# Patient Record
Sex: Male | Born: 1965 | Race: White | Hispanic: No | Marital: Married | Smoking: Former smoker
Health system: Southern US, Community
[De-identification: ages and names within clinical notes are randomized; demographics above are authoritative.]

## PROBLEM LIST (undated history)

## (undated) DIAGNOSIS — I10 Essential (primary) hypertension: Secondary | ICD-10-CM

## (undated) DIAGNOSIS — J45909 Unspecified asthma, uncomplicated: Secondary | ICD-10-CM

## (undated) HISTORY — PX: NASAL SEPTUM SURGERY: SHX37

---

## 2005-04-14 ENCOUNTER — Ambulatory Visit: Payer: Self-pay | Admitting: Internal Medicine

## 2005-04-19 ENCOUNTER — Ambulatory Visit: Payer: Self-pay | Admitting: Cardiology

## 2005-04-19 IMAGING — CT CT PARANASAL SINUSES LIMITED
1 of 2 series · 16 of 25 positions shown, 20 images · non-contrast
Comparison: none

HISTORY: Cough, sinus pain and pressure

[Series 4: ltd sinus 3.0 h30s · axial · 0.29mm/px · z∈[-93,+5]mm · 16 of 24 slices shown, 20 images]
[im 2/24  brain]
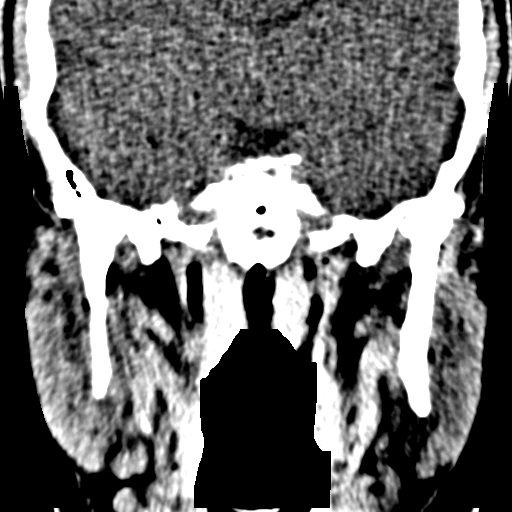
[im 2/24  bone]
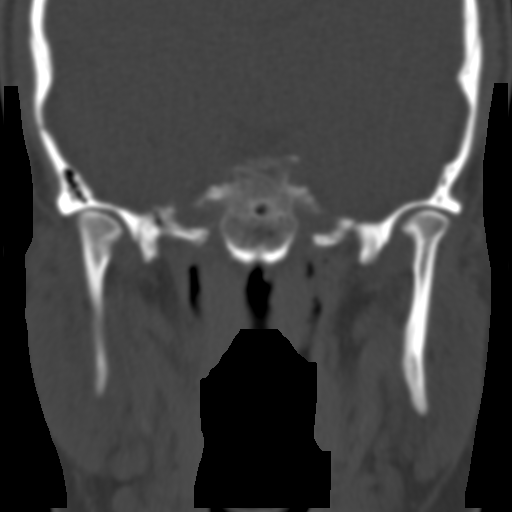
[im 4/24  bone]
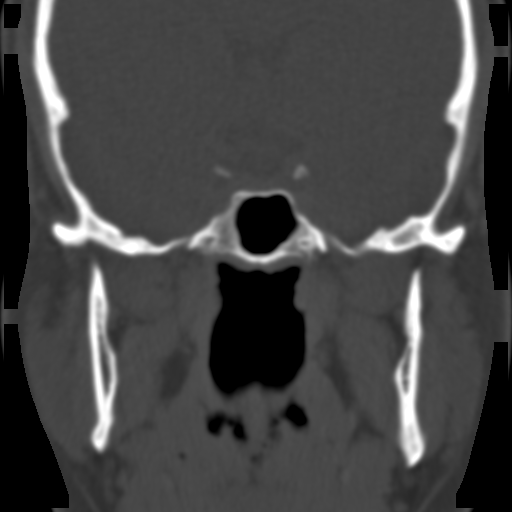
[im 5/24  bone]
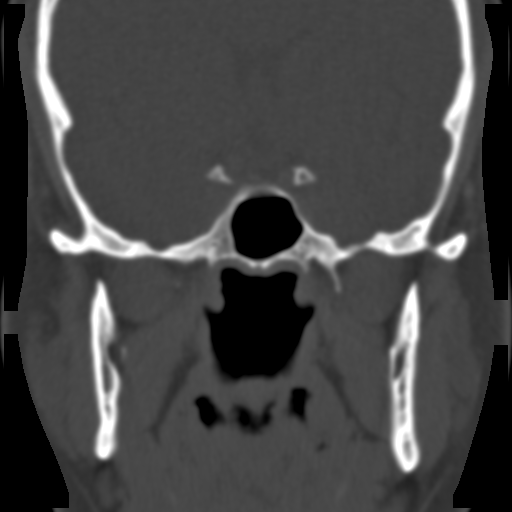
[im 6/24  bone]
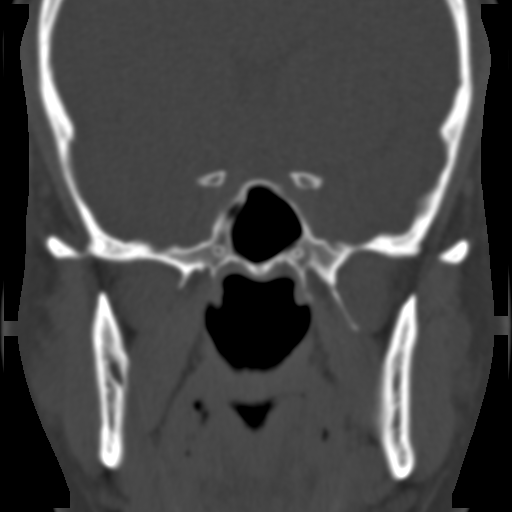
[im 8/24  brain]
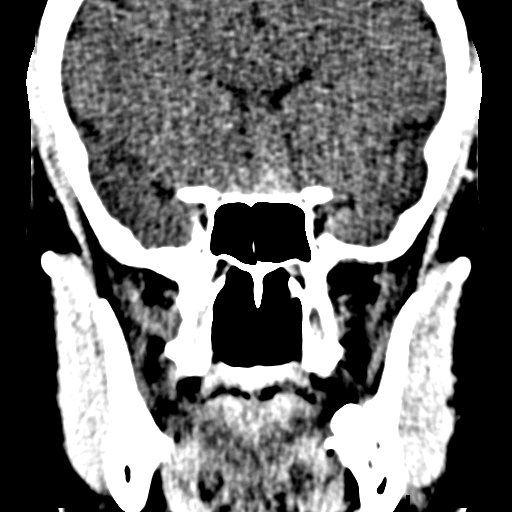
[im 8/24  bone]
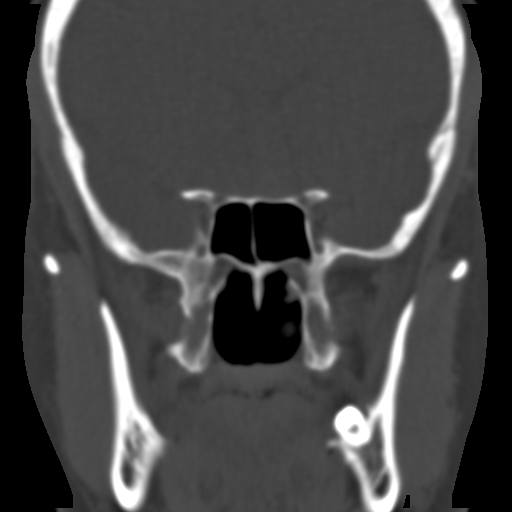
[im 9/24  bone]
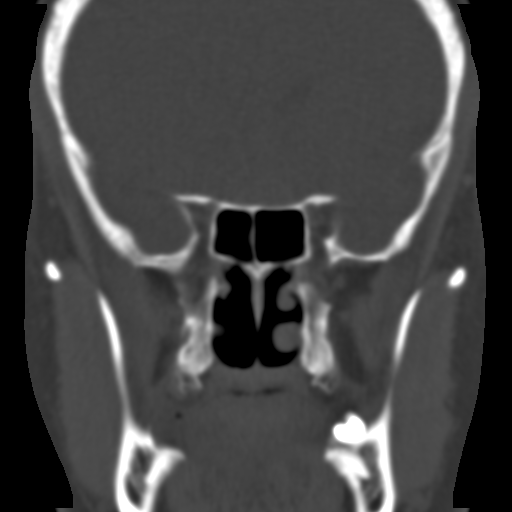
[im 10/24  bone]
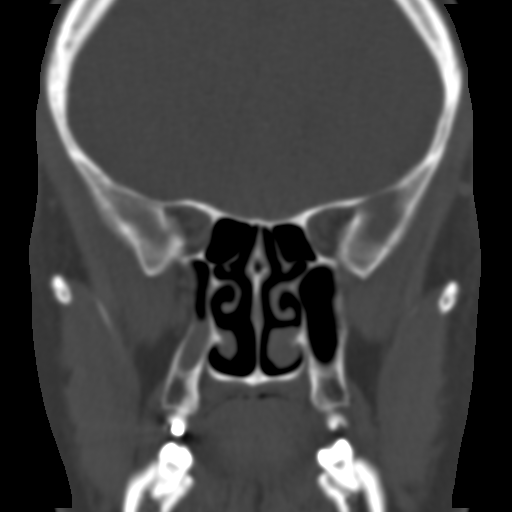
[im 12/24  bone]
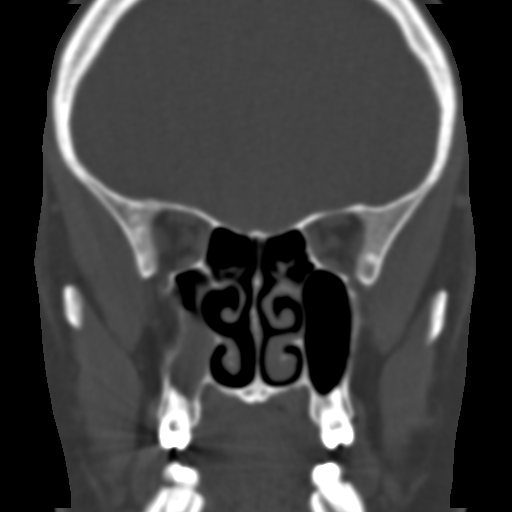
[im 13/24  brain]
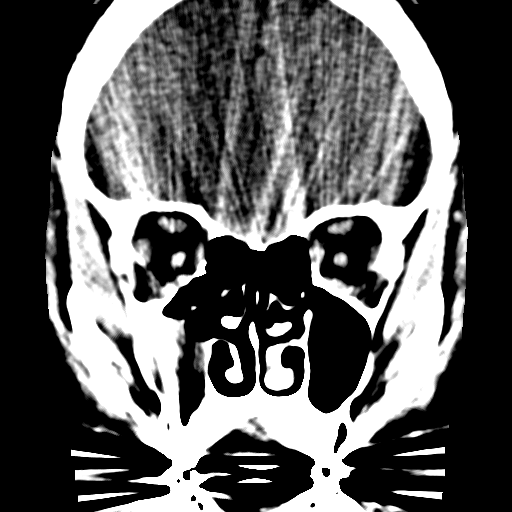
[im 13/24  bone]
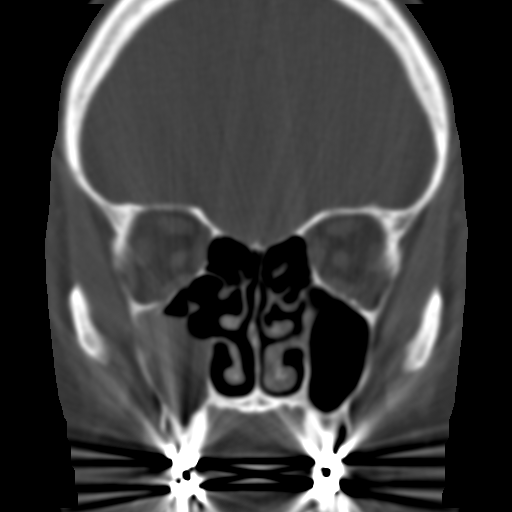
[im 15/24  bone]
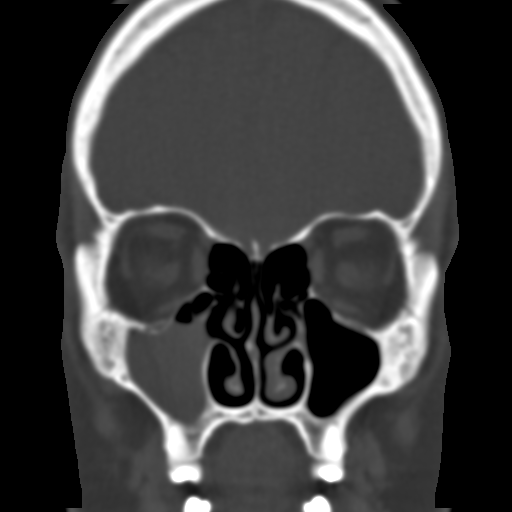
[im 16/24  bone]
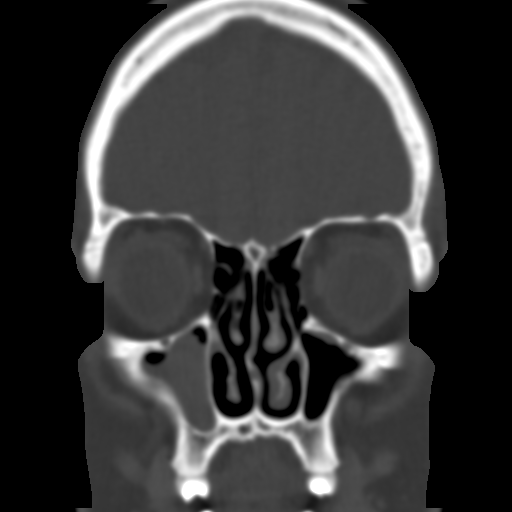
[im 17/24  bone]
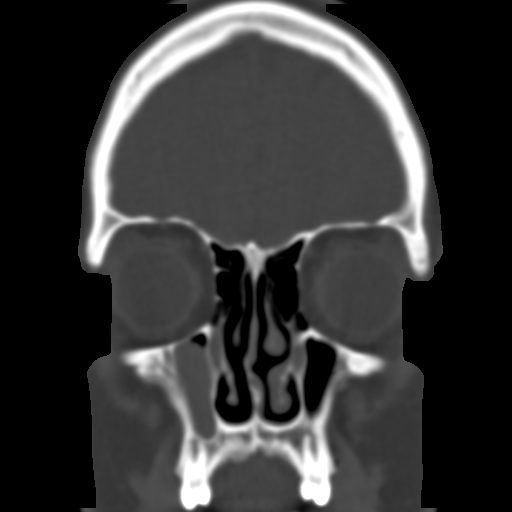
[im 19/24  brain]
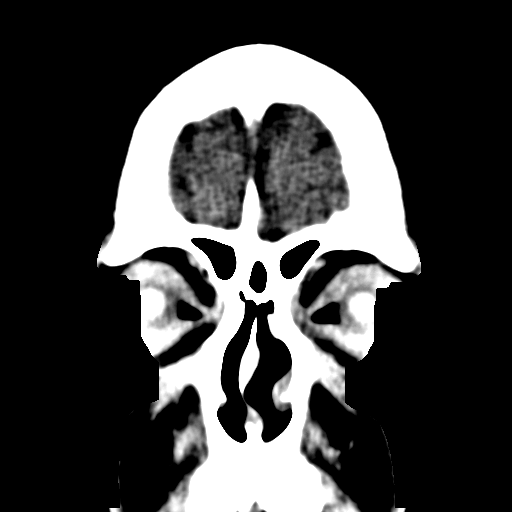
[im 19/24  bone]
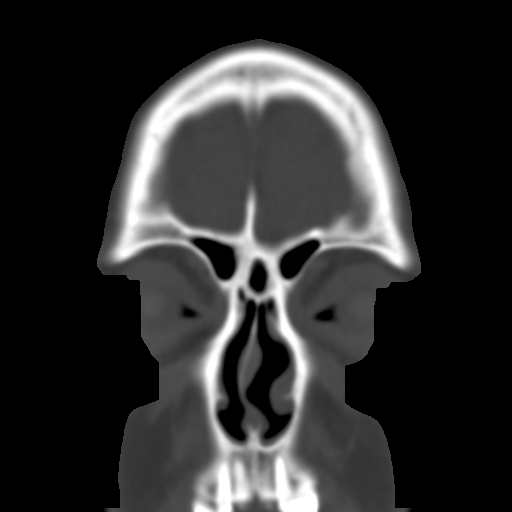
[im 20/24  bone]
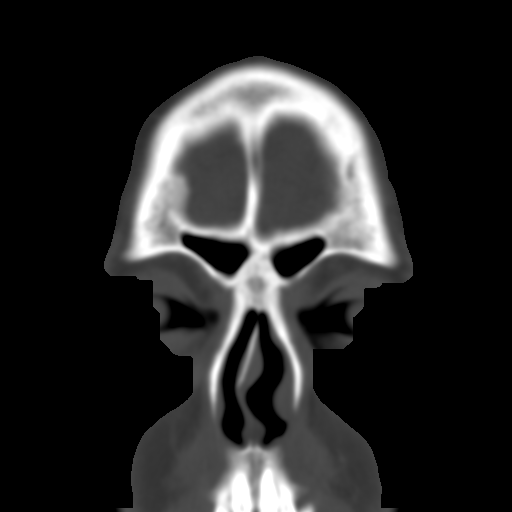
[im 21/24  bone]
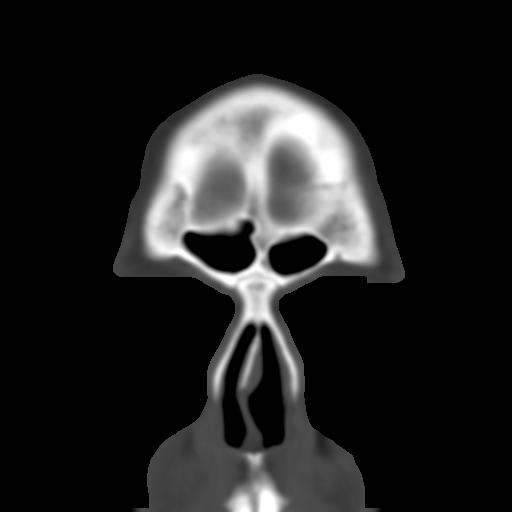
[im 23/24  bone]
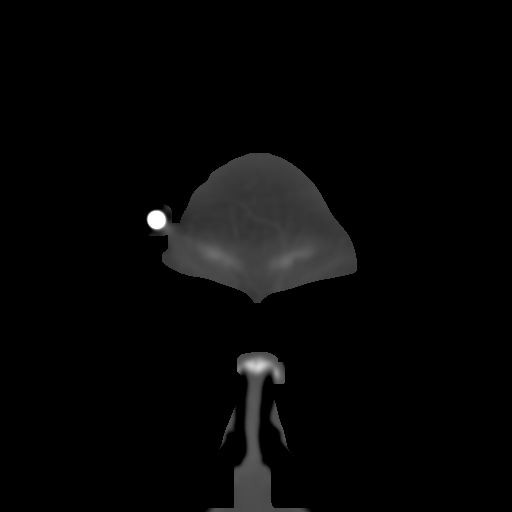

[16 of 25 positions shown; findings below may reference images not displayed]

CT SINUSES LIMITED WITHOUT CONTRAST:

Multidetector noncontrast contiguous CT images paranasal sinuses.
No prior exam for comparison.
Right side of face marked with BB.

Mild nasal septum deviation to right anteriorly.
Subtotal opacification right maxillary sinus with air-fluid level compatible
with acute sinusitis.
Remaining paranasal sinuses clear.
No fracture or bone destruction.
Scattered beam hardening artifacts from dental fillings.
IMPRESSION: Right maxillary sinusitis.

## 2005-04-30 ENCOUNTER — Ambulatory Visit: Payer: Self-pay | Admitting: Pulmonary Disease

## 2005-05-10 ENCOUNTER — Ambulatory Visit: Payer: Self-pay | Admitting: Internal Medicine

## 2005-09-24 ENCOUNTER — Ambulatory Visit: Payer: Self-pay | Admitting: Unknown Physician Specialty

## 2006-10-23 ENCOUNTER — Other Ambulatory Visit: Payer: Self-pay

## 2006-10-23 ENCOUNTER — Emergency Department: Payer: Self-pay | Admitting: Emergency Medicine

## 2006-10-23 IMAGING — CR DG CHEST 2V
1 series · 2 of 2 positions shown · non-contrast
Comparison: none

REASON FOR EXAM: chest pain
COMMENTS:

PROCEDURE:     DXR - DXR CHEST PA (OR AP) AND LATERAL  - [DATE]  [DATE]
RESULT:     The lung fields are clear. The heart, mediastinal and osseous
structures show no significant abnormalities.

[Series 1: view not recorded · 0.17mm/px · 2 of 2 slices shown]
[im 1/2]
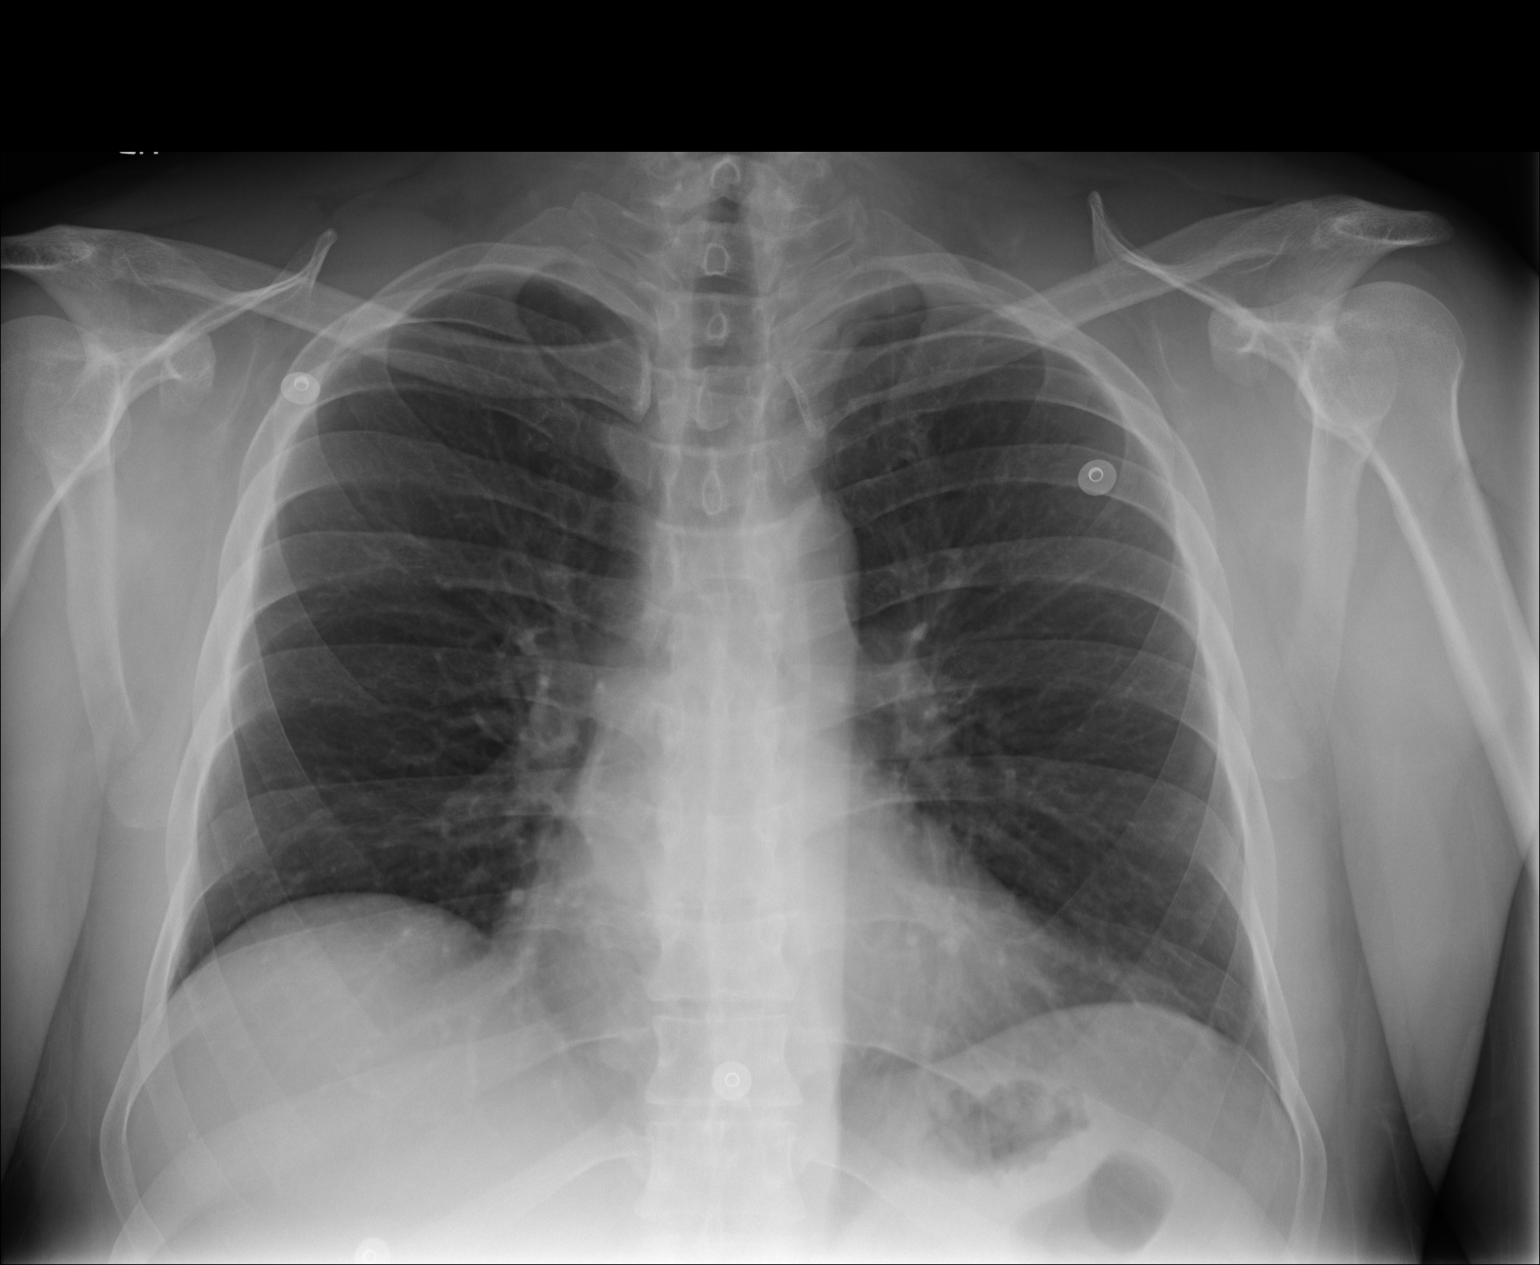
[im 2/2]
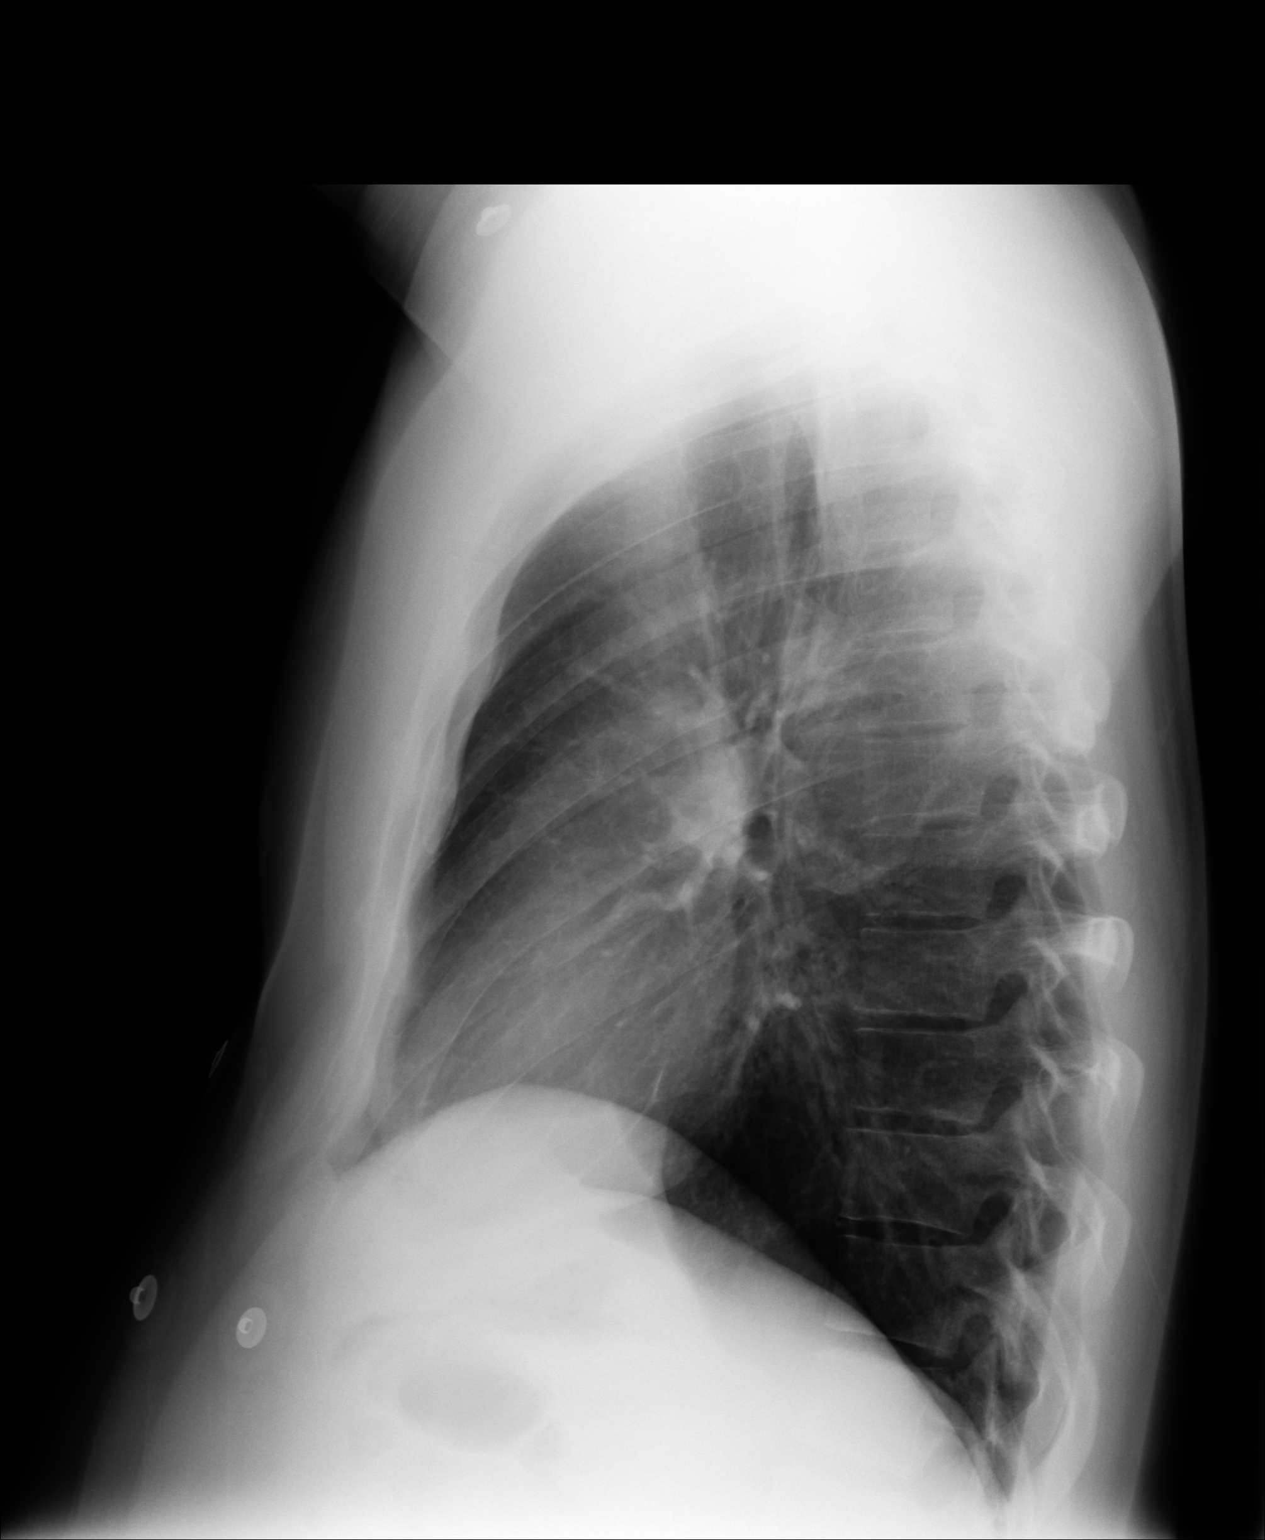

[2 of 2 positions shown; findings below may reference images not displayed]

IMPRESSION: 1.     No significant abnormalities are noted.

## 2006-10-25 ENCOUNTER — Ambulatory Visit: Payer: Self-pay | Admitting: Cardiology

## 2008-10-07 ENCOUNTER — Emergency Department: Payer: Self-pay | Admitting: Internal Medicine

## 2009-02-25 ENCOUNTER — Inpatient Hospital Stay: Payer: Self-pay | Admitting: Psychiatry

## 2009-06-10 ENCOUNTER — Emergency Department: Payer: Self-pay | Admitting: Emergency Medicine

## 2010-08-27 ENCOUNTER — Emergency Department: Payer: Self-pay | Admitting: *Deleted

## 2010-08-27 IMAGING — CR DG CHEST 1V PORT
1 series · 1 of 1 positions shown · non-contrast
Comparison: none

REASON FOR EXAM: cp
COMMENTS:

[view not recorded]
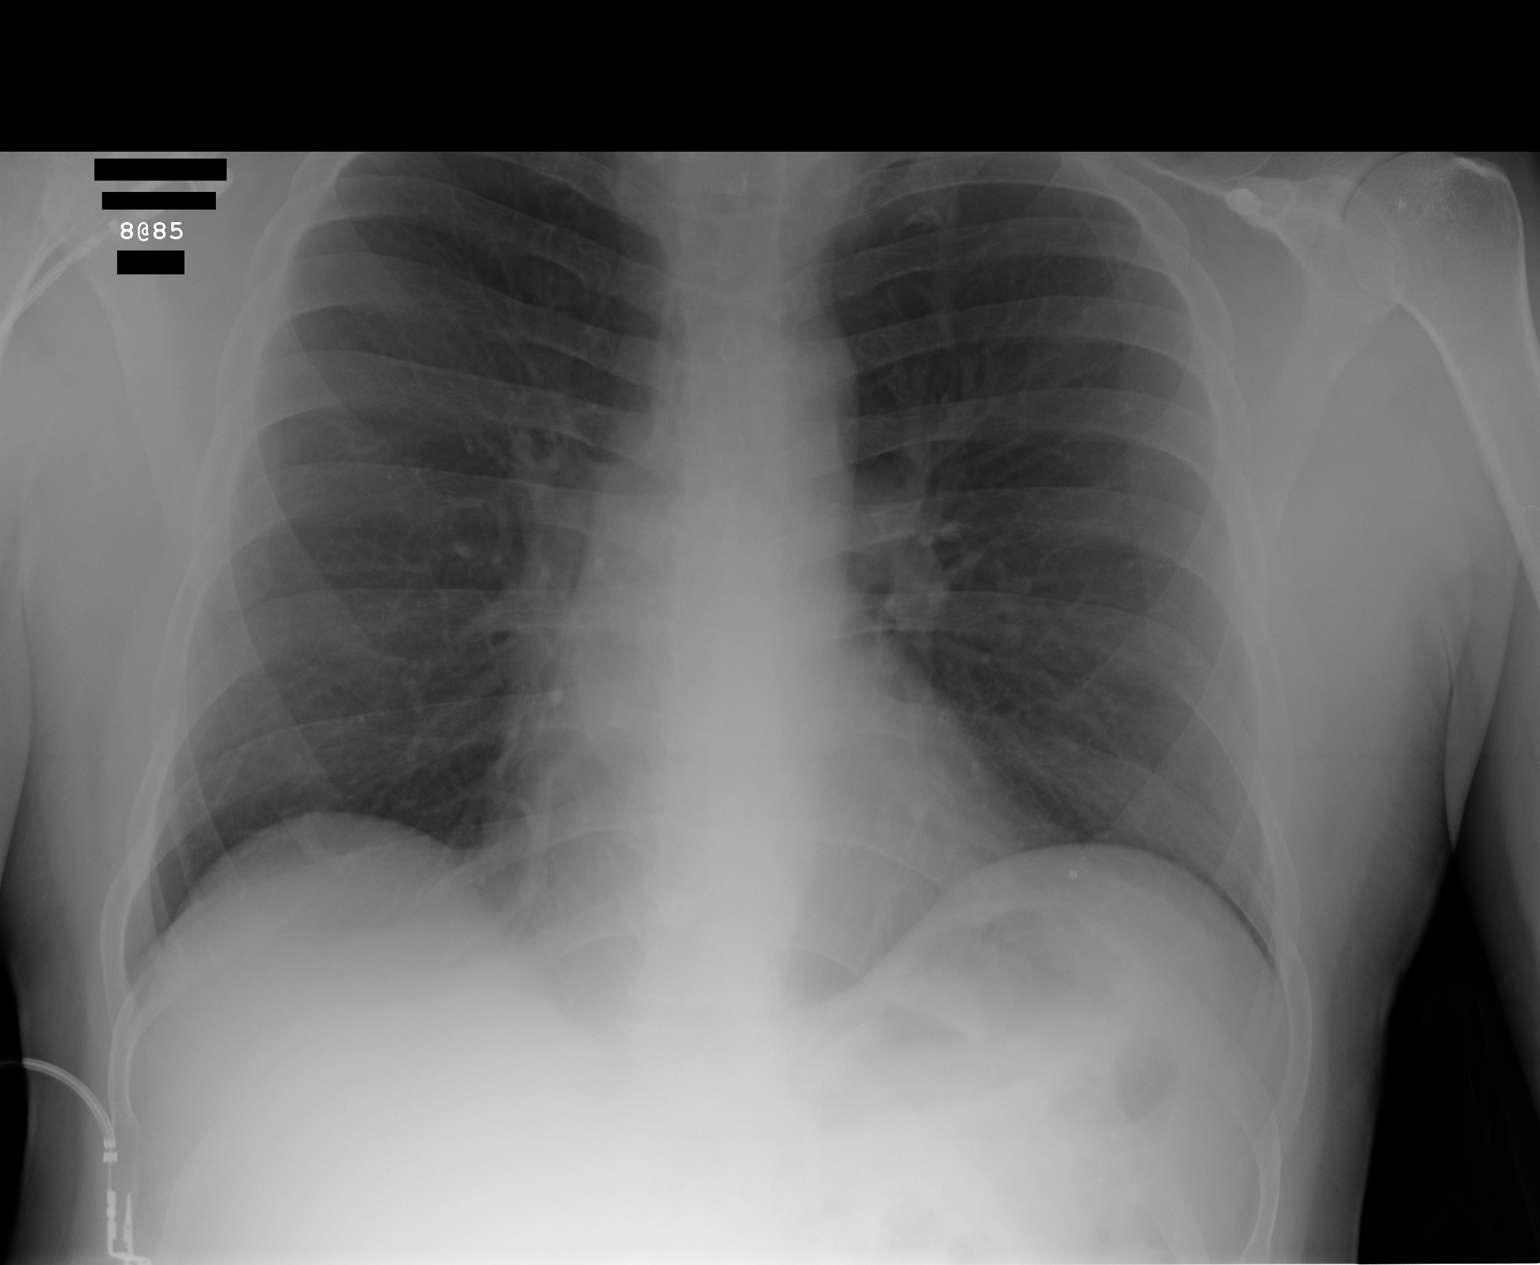

[1 of 1 positions shown; findings below may reference images not displayed]

PROCEDURE:     DXR - DXR PORTABLE CHEST SINGLE VIEW  - [DATE] [DATE]

RESULT:     Comparison is made to the prior exam of [DATE].

The lung fields are clear. No pneumonia, pneumothorax or pleural effusion is
seen. The heart size is normal. The mediastinal and osseous structures show
no significant abnormalities.
IMPRESSION: No acute changes are identified.

## 2014-12-05 ENCOUNTER — Other Ambulatory Visit: Payer: Self-pay

## 2014-12-05 ENCOUNTER — Emergency Department: Payer: BLUE CROSS/BLUE SHIELD

## 2014-12-05 ENCOUNTER — Emergency Department
Admission: EM | Admit: 2014-12-05 | Discharge: 2014-12-05 | Disposition: A | Discharge status: Home / Self Care | Payer: BLUE CROSS/BLUE SHIELD | Attending: Emergency Medicine | Admitting: Emergency Medicine

## 2014-12-05 DIAGNOSIS — Z880 Allergy status to penicillin: Secondary | ICD-10-CM | POA: Diagnosis not present

## 2014-12-05 DIAGNOSIS — R079 Chest pain, unspecified: Secondary | ICD-10-CM | POA: Insufficient documentation

## 2014-12-05 DIAGNOSIS — R0602 Shortness of breath: Secondary | ICD-10-CM | POA: Diagnosis not present

## 2014-12-05 LAB — BASIC METABOLIC PANEL
Anion gap: 9 (ref 5–15)
BUN: 13 mg/dL (ref 6–20)
CALCIUM: 9.1 mg/dL (ref 8.9–10.3)
CO2: 25 mmol/L (ref 22–32)
CREATININE: 0.9 mg/dL (ref 0.61–1.24)
Chloride: 102 mmol/L (ref 101–111)
GFR calc non Af Amer: >60 mL/min (ref >60)
Glucose, Bld: 123 mg/dL — ABNORMAL HIGH (ref 65–99)
Potassium: 3.6 mmol/L (ref 3.5–5.1)
SODIUM: 136 mmol/L (ref 135–145)

## 2014-12-05 LAB — CBC
HCT: 47.7 % (ref 40.0–52.0)
Hemoglobin: 15.8 g/dL (ref 13.0–18.0)
MCH: 30.4 pg (ref 26.0–34.0)
MCHC: 33.2 g/dL (ref 32.0–36.0)
MCV: 91.7 fL (ref 80.0–100.0)
Platelets: 351 10*3/uL (ref 150–440)
RBC: 5.21 MIL/uL (ref 4.40–5.90)
RDW: 13.3 % (ref 11.5–14.5)
WBC: 11.6 10*3/uL — AB (ref 3.8–10.6)

## 2014-12-05 LAB — TROPONIN I: Troponin I: <0.03 ng/mL (ref <0.031)

## 2014-12-05 IMAGING — CR DG CHEST 2V
1 series · 2 of 2 positions shown · non-contrast
Comparison: [DATE]

CLINICAL DATA: Left-sided chest pain with radiation to left arm for
1 day. Intermittent difficulty breathing

EXAM:
CHEST  2 VIEW

[Series 1: dg chest 2 view · 0.14mm/px · 2 of 2 slices shown]
[im 1/2]
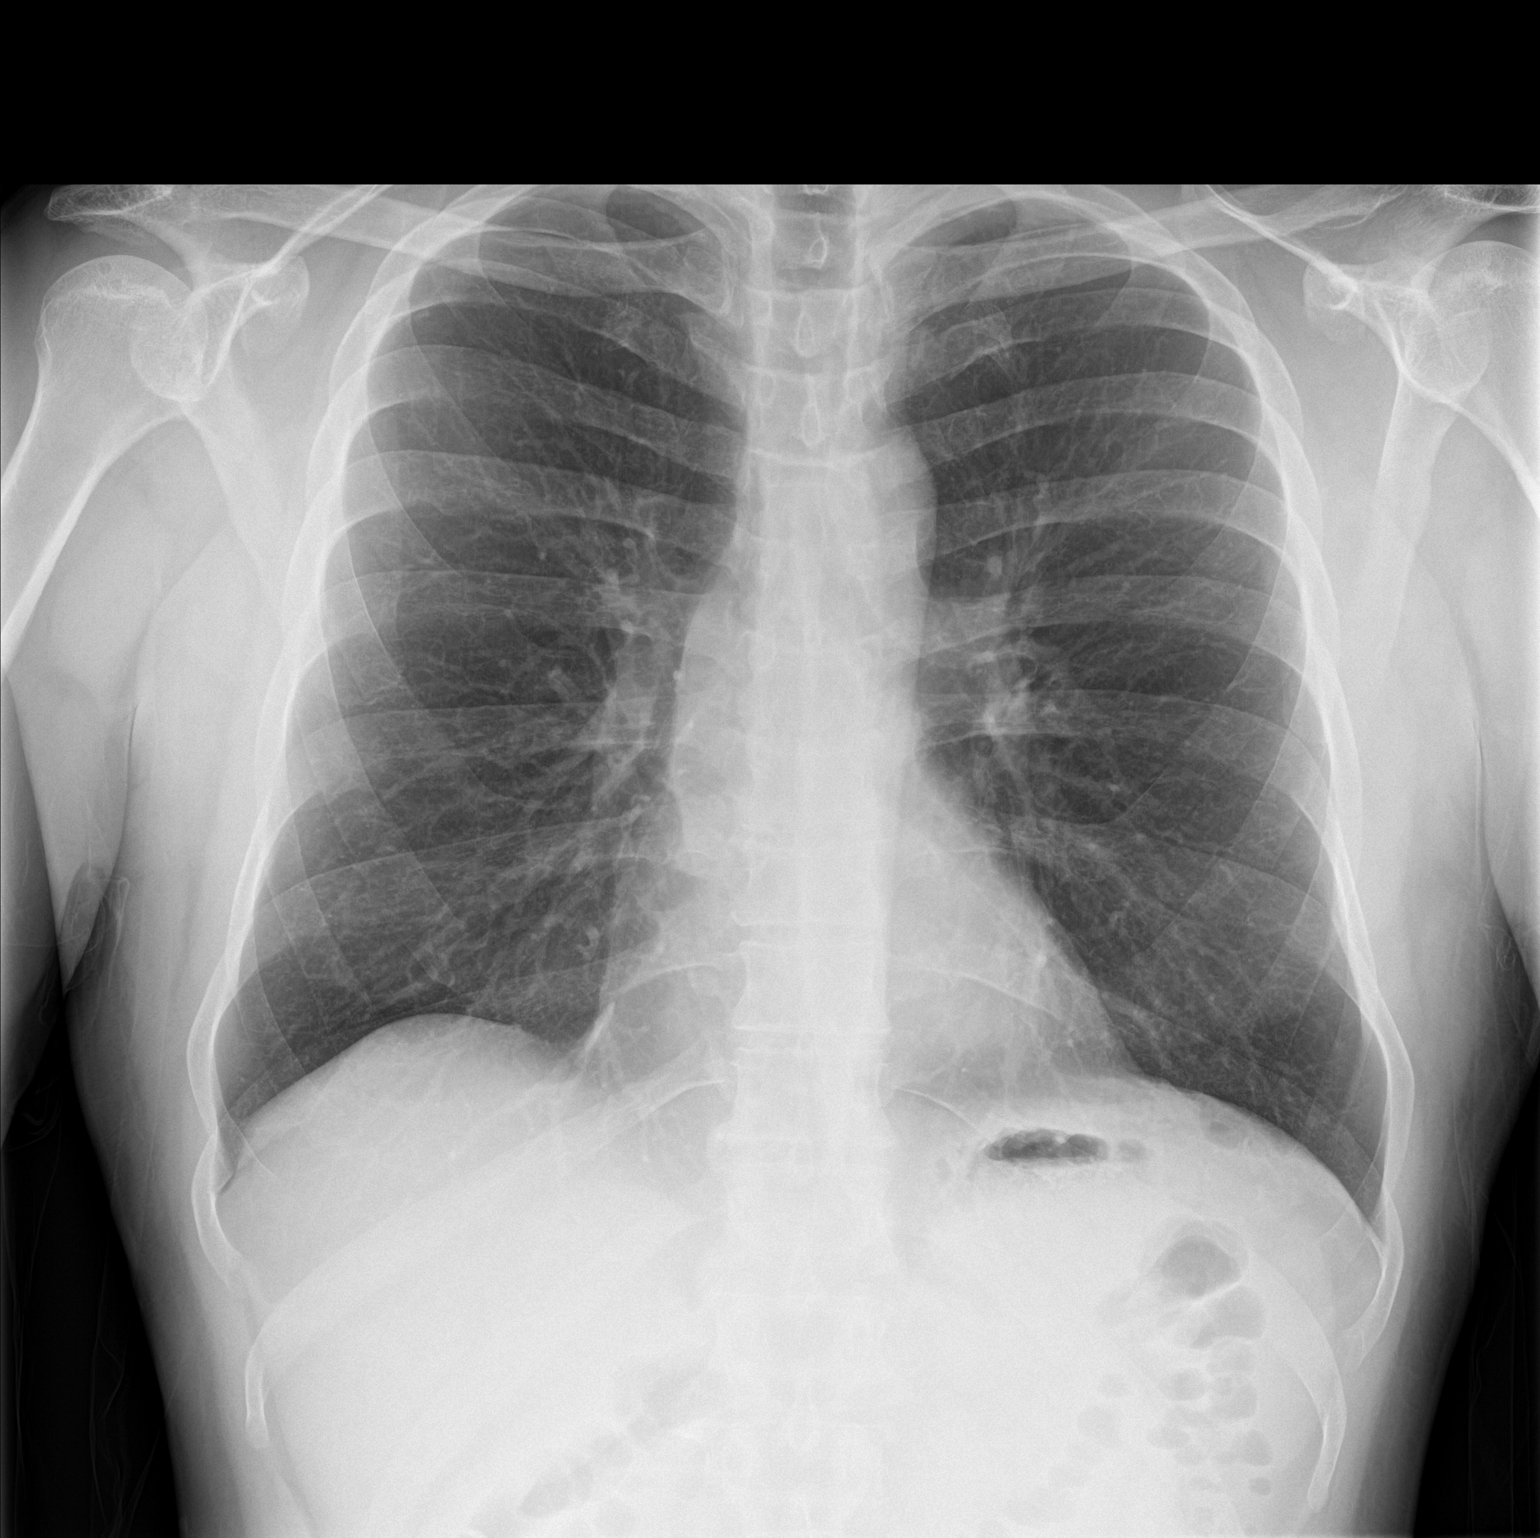
[im 2/2]
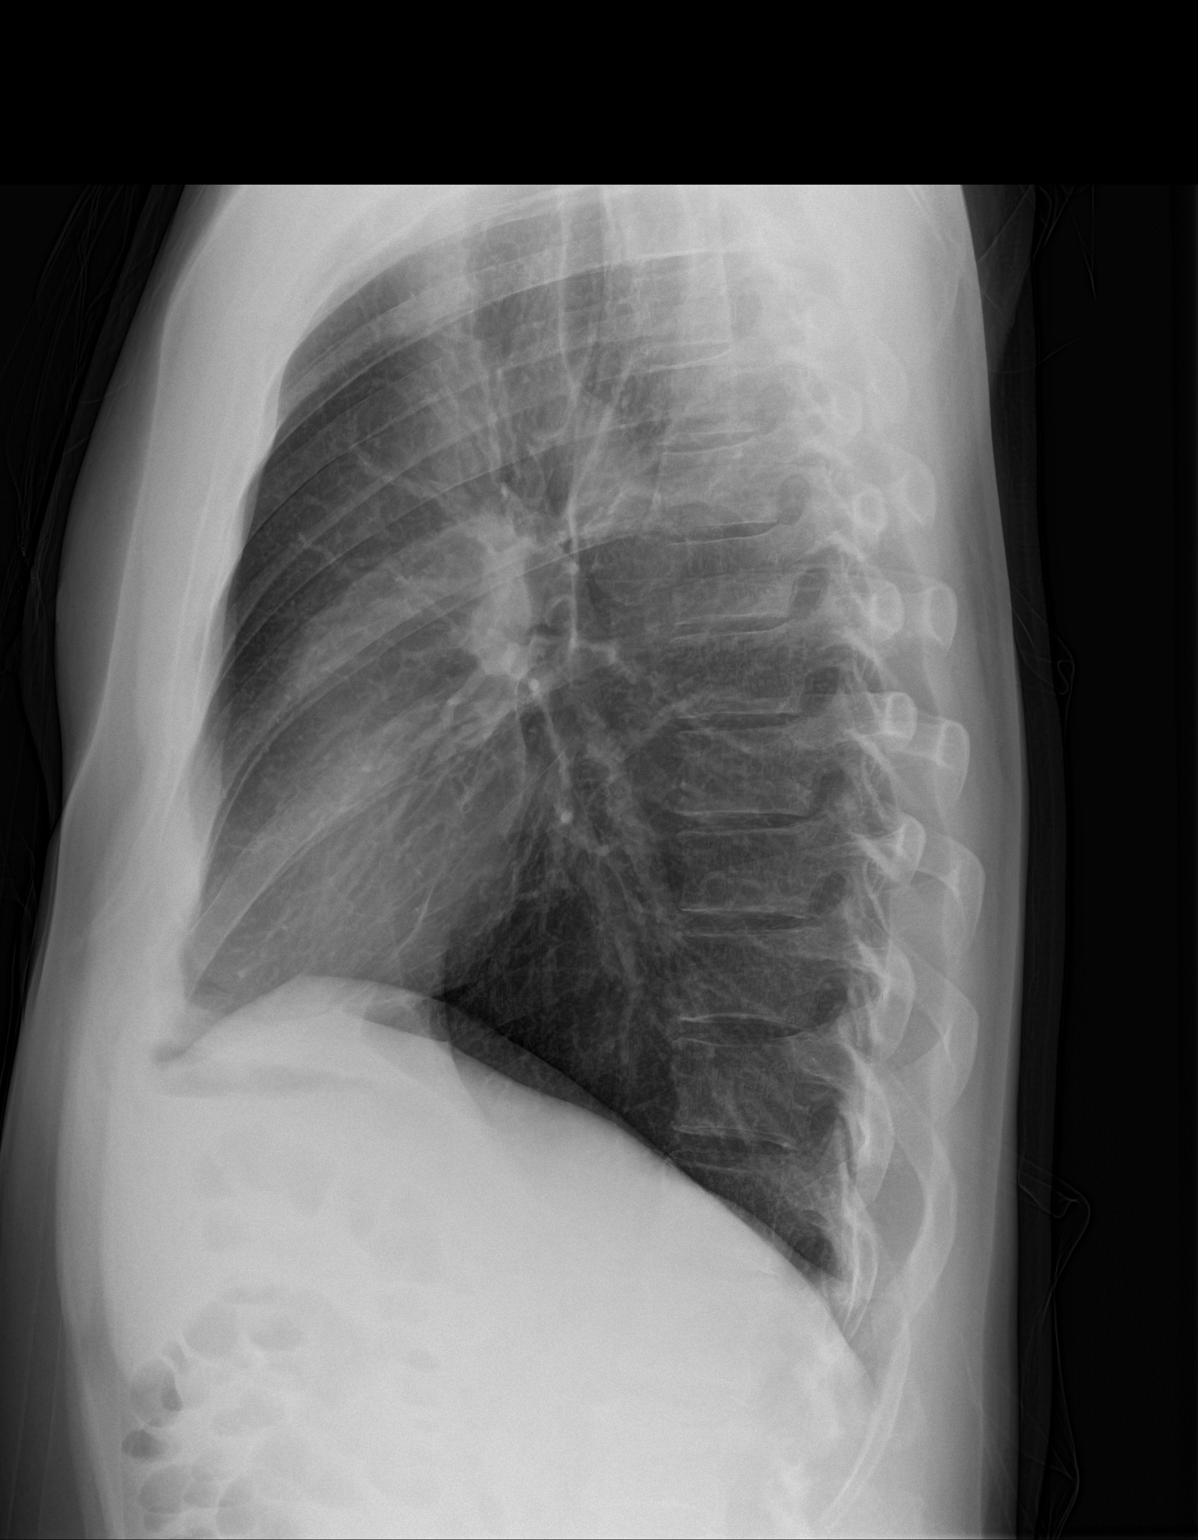

[2 of 2 positions shown; findings below may reference images not displayed]

FINDINGS: Lungs are clear. Heart size and pulmonary vascularity are normal. No
adenopathy. No pneumothorax. No bone lesions.
IMPRESSION: No abnormality noted.

## 2014-12-05 MED ORDER — ASPIRIN 81 MG PO CHEW: 81.0000 mg | CHEWABLE_TABLET | Freq: Every day | ORAL | Status: AC

## 2014-12-05 NOTE — Discharge Instructions (Signed)

## 2014-12-05 NOTE — ED Provider Notes (Signed)
[facility] Emergency Department Provider Note     Time seen: ----------------------------------------- 3:40 PM on 12/05/2014 -----------------------------------------    I have reviewed the triage vital signs and the nursing notes.   HISTORY  Chief Complaint Chest Pain    HPI Adam Newman is a 49 y.o. male who presents ER for left-sided chest pain with radiation to the left arm since yesterday. Patient also reports shortness of breath. Patient describes the pain like an elephant was sitting on his chest. Patient was unsure if this was asthma or reflux related. Patient states had a stress test in the past for same. 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 Denies any other complaints.   No past medical history on file.  There are no active problems to display for this patient.   No past surgical history on file.  Allergies Penicillins  Social History Social History  Substance Use Topics  . Smoking status: Not on file  . Smokeless tobacco: Not on file  . Alcohol Use: Not on file    Review of Systems Constitutional: Negative for fever. Eyes: Negative for visual changes. ENT: Negative for sore throat. Cardiovascular: Positive for chest pain Respiratory: Positive for shortness of breath Gastrointestinal: Negative for abdominal pain, vomiting and diarrhea. Genitourinary: Negative for dysuria. Musculoskeletal: Negative for back pain. Skin: Negative for rash. Neurological: Negative for headaches, focal weakness or numbness.  10-point ROS otherwise negative.  ____________________________________________   PHYSICAL EXAM:  VITAL SIGNS: ED Triage Vitals  Enc Vitals Group     BP 12/05/14 1245 147/95 mmHg     Pulse Rate 12/05/14 1245 89     Resp 12/05/14 1245 18     Temp 12/05/14 1245 98.3 F (36.8 C)     Temp Source 12/05/14 1245 Oral     SpO2 12/05/14 1245 98 %     Weight 12/05/14 1245 165 lb (74.844  kg)     Height 12/05/14 1245 5\' 10"  (1.778 m)     Head Cir --      Peak Flow --      Pain Score 12/05/14 1246 7     Pain Loc --      Pain Edu? --      Excl. in GC? --     Constitutional: Alert and oriented. Well appearing and in no distress. Eyes: Conjunctivae are normal. PERRL. Normal extraocular movements. ENT   Head: Normocephalic and atraumatic.   Nose: No congestion/rhinnorhea.   Mouth/Throat: Mucous membranes are moist.   Neck: No stridor. Cardiovascular: Normal rate, regular rhythm. Normal and symmetric distal pulses are present in all extremities. No murmurs, rubs, or gallops. Respiratory: Normal respiratory effort without tachypnea nor retractions. Breath sounds are clear and equal bilaterally. No wheezes/rales/rhonchi. Gastrointestinal: Soft and nontender. No distention. No abdominal bruits.  Musculoskeletal: Nontender with normal range of motion in all extremities. No joint effusions.  No lower extremity tenderness nor edema. Neurologic:  Normal speech and language. No gross focal neurologic deficits are appreciated. Speech is normal. No gait instability. Skin:  Skin is warm, dry and intact. No rash noted. Psychiatric: Mood and affect are normal. Speech and behavior are normal. Patient exhibits appropriate insight and judgment. ____________________________________________  EKG: Interpreted by me. Normal sinus rhythm with occasional PVCs, left atrial enlargement, normal axis. Rate is 82 bpm  ____________________________________________  ED COURSE:  Pertinent labs & imaging results that were available during my care of the patient were reviewed by me and considered  in my medical decision making (see chart for details). Patient is in no acute distress, describes severe symptoms like an elephant was sitting on his chest. We'll review recent stress testing and cardiac labs. ____________________________________________    LABS (pertinent  positives/negatives)  Labs Reviewed  BASIC METABOLIC PANEL - Abnormal; Notable for the following:    Glucose, Bld 123 (*)    All other components within normal limits  CBC - Abnormal; Notable for the following:    WBC 11.6 (*)    All other components within normal limits  TROPONIN I    RADIOLOGY  Chest x-ray Is unremarkable ____________________________________________  FINAL ASSESSMENT AND PLAN  Chest pain  Plan: Patient with labs and imaging as dictated above. Patient with nonspecific chest pain. Symptoms sounded severe, I recommend repeatedly that he be admitted in the hospital for rule out and stress testing. Patient does not want to stay and is leaving against my advice. He'll start on a baby aspirin daily and I encouraged follow-up as soon as possible with his doctor if he is not willing to stay.   Emily Filbert, MD   Emily Filbert, MD 12/05/14 [phone]

## 2014-12-05 NOTE — ED Notes (Signed)
Pt reports left sided chest pain with radiation to left arm since yesterday. Pt also reports sob.

## 2014-12-11 ENCOUNTER — Other Ambulatory Visit
Admission: RE | Admit: 2014-12-11 | Discharge: 2014-12-11 | Disposition: A | Discharge status: Home / Self Care | Payer: BLUE CROSS/BLUE SHIELD | Source: Ambulatory Visit | Attending: Internal Medicine | Admitting: Internal Medicine

## 2014-12-11 DIAGNOSIS — K922 Gastrointestinal hemorrhage, unspecified: Secondary | ICD-10-CM | POA: Diagnosis present

## 2014-12-11 DIAGNOSIS — R079 Chest pain, unspecified: Secondary | ICD-10-CM | POA: Diagnosis present

## 2014-12-11 LAB — FIBRIN DERIVATIVES D-DIMER (ARMC ONLY): FIBRIN DERIVATIVES D-DIMER (ARMC): 255.7 (ref 0–499)

## 2015-12-14 ENCOUNTER — Encounter: Payer: Self-pay | Admitting: *Deleted

## 2015-12-14 ENCOUNTER — Emergency Department
Admission: EM | Admit: 2015-12-14 | Discharge: 2015-12-14 | Disposition: A | Discharge status: Home / Self Care | Payer: BLUE CROSS/BLUE SHIELD | Attending: Emergency Medicine | Admitting: Emergency Medicine

## 2015-12-14 ENCOUNTER — Emergency Department: Payer: BLUE CROSS/BLUE SHIELD

## 2015-12-14 DIAGNOSIS — J45909 Unspecified asthma, uncomplicated: Secondary | ICD-10-CM | POA: Insufficient documentation

## 2015-12-14 DIAGNOSIS — J209 Acute bronchitis, unspecified: Secondary | ICD-10-CM

## 2015-12-14 DIAGNOSIS — R0602 Shortness of breath: Secondary | ICD-10-CM | POA: Diagnosis present

## 2015-12-14 DIAGNOSIS — F17200 Nicotine dependence, unspecified, uncomplicated: Secondary | ICD-10-CM | POA: Insufficient documentation

## 2015-12-14 HISTORY — DX: Unspecified asthma, uncomplicated: J45.909

## 2015-12-14 LAB — COMPREHENSIVE METABOLIC PANEL
ALBUMIN: 4.3 g/dL (ref 3.5–5.0)
ALT: 17 U/L (ref 17–63)
AST: 15 U/L (ref 15–41)
Alkaline Phosphatase: 46 U/L (ref 38–126)
Anion gap: 7 (ref 5–15)
BUN: 10 mg/dL (ref 6–20)
CALCIUM: 8.9 mg/dL (ref 8.9–10.3)
CHLORIDE: 105 mmol/L (ref 101–111)
CO2: 25 mmol/L (ref 22–32)
CREATININE: 0.79 mg/dL (ref 0.61–1.24)
GFR calc non Af Amer: >60 mL/min (ref >60)
Glucose, Bld: 93 mg/dL (ref 65–99)
Potassium: 3.7 mmol/L (ref 3.5–5.1)
SODIUM: 137 mmol/L (ref 135–145)
TOTAL PROTEIN: 7.5 g/dL (ref 6.5–8.1)
Total Bilirubin: 0.7 mg/dL (ref 0.3–1.2)

## 2015-12-14 LAB — CBC WITH DIFFERENTIAL/PLATELET
BASOS PCT: 2 %
Basophils Absolute: 0.3 10*3/uL — ABNORMAL HIGH (ref 0–0.1)
EOS ABS: 1.2 10*3/uL — AB (ref 0–0.7)
Eosinophils Relative: 9 %
HCT: 44 % (ref 40.0–52.0)
Hemoglobin: 15 g/dL (ref 13.0–18.0)
Lymphocytes Relative: 20 %
Lymphs Abs: 2.6 10*3/uL (ref 1.0–3.6)
MCH: 31 pg (ref 26.0–34.0)
MCHC: 34 g/dL (ref 32.0–36.0)
MCV: 91.1 fL (ref 80.0–100.0)
MONO ABS: 1 10*3/uL (ref 0.2–1.0)
MONOS PCT: 8 %
NEUTROS PCT: 61 %
Neutro Abs: 7.9 10*3/uL — ABNORMAL HIGH (ref 1.4–6.5)
PLATELETS: 288 10*3/uL (ref 150–440)
RBC: 4.83 MIL/uL (ref 4.40–5.90)
RDW: 13.5 % (ref 11.5–14.5)
WBC: 12.9 10*3/uL — ABNORMAL HIGH (ref 3.8–10.6)

## 2015-12-14 LAB — BRAIN NATRIURETIC PEPTIDE: B Natriuretic Peptide: 21 pg/mL (ref 0.0–100.0)

## 2015-12-14 LAB — FIBRIN DERIVATIVES D-DIMER (ARMC ONLY): FIBRIN DERIVATIVES D-DIMER (ARMC): 254 (ref 0–499)

## 2015-12-14 IMAGING — CR DG CHEST 2V
1 series · 2 of 2 positions shown · non-contrast
Comparison: [DATE]

CLINICAL DATA: Possible pneumonia.  Bronchitis.  Asthma.

EXAM:
CHEST  2 VIEW

[Series 1: w chest pa · 0.14mm/px · 2 of 2 slices shown]
[im 1/2]
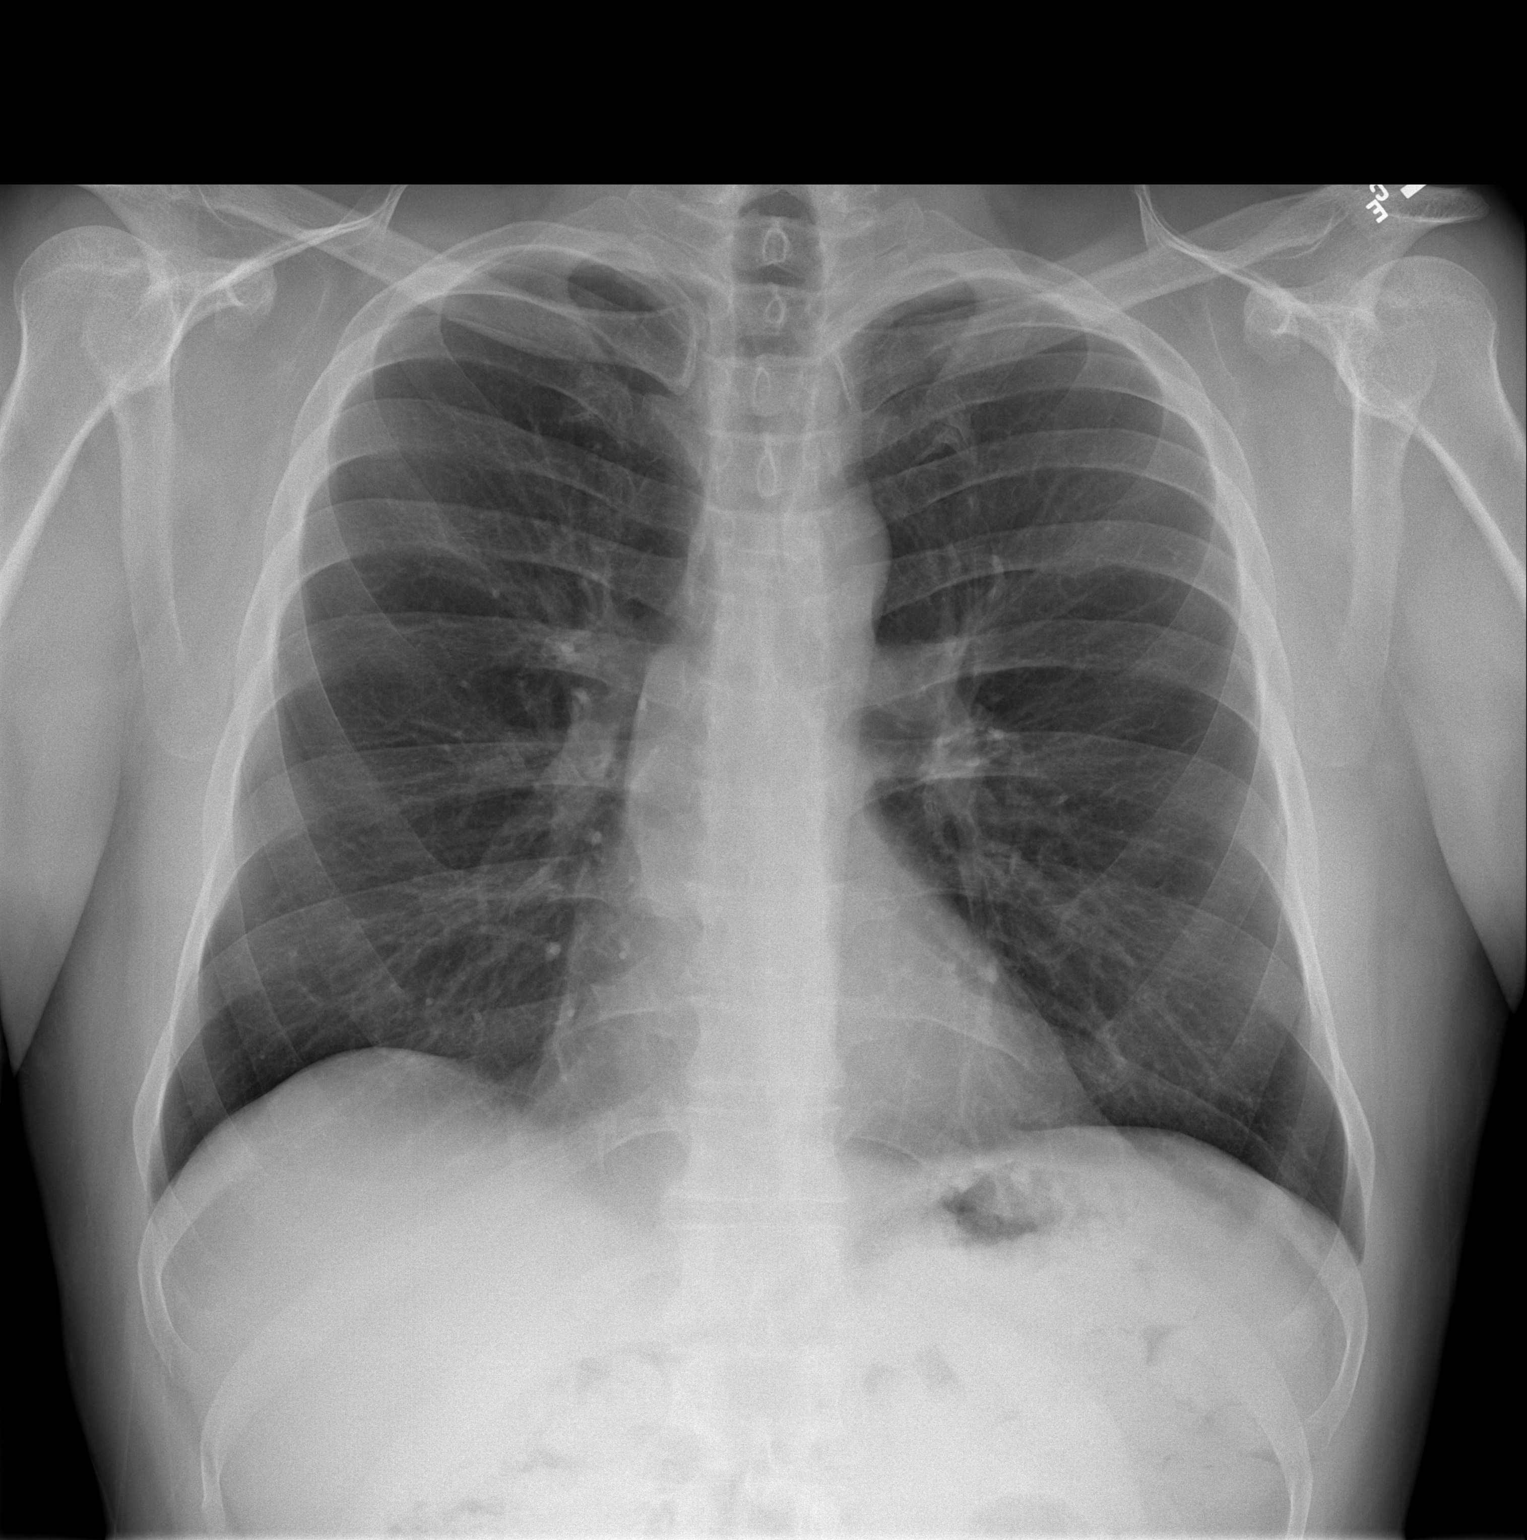
[im 2/2]
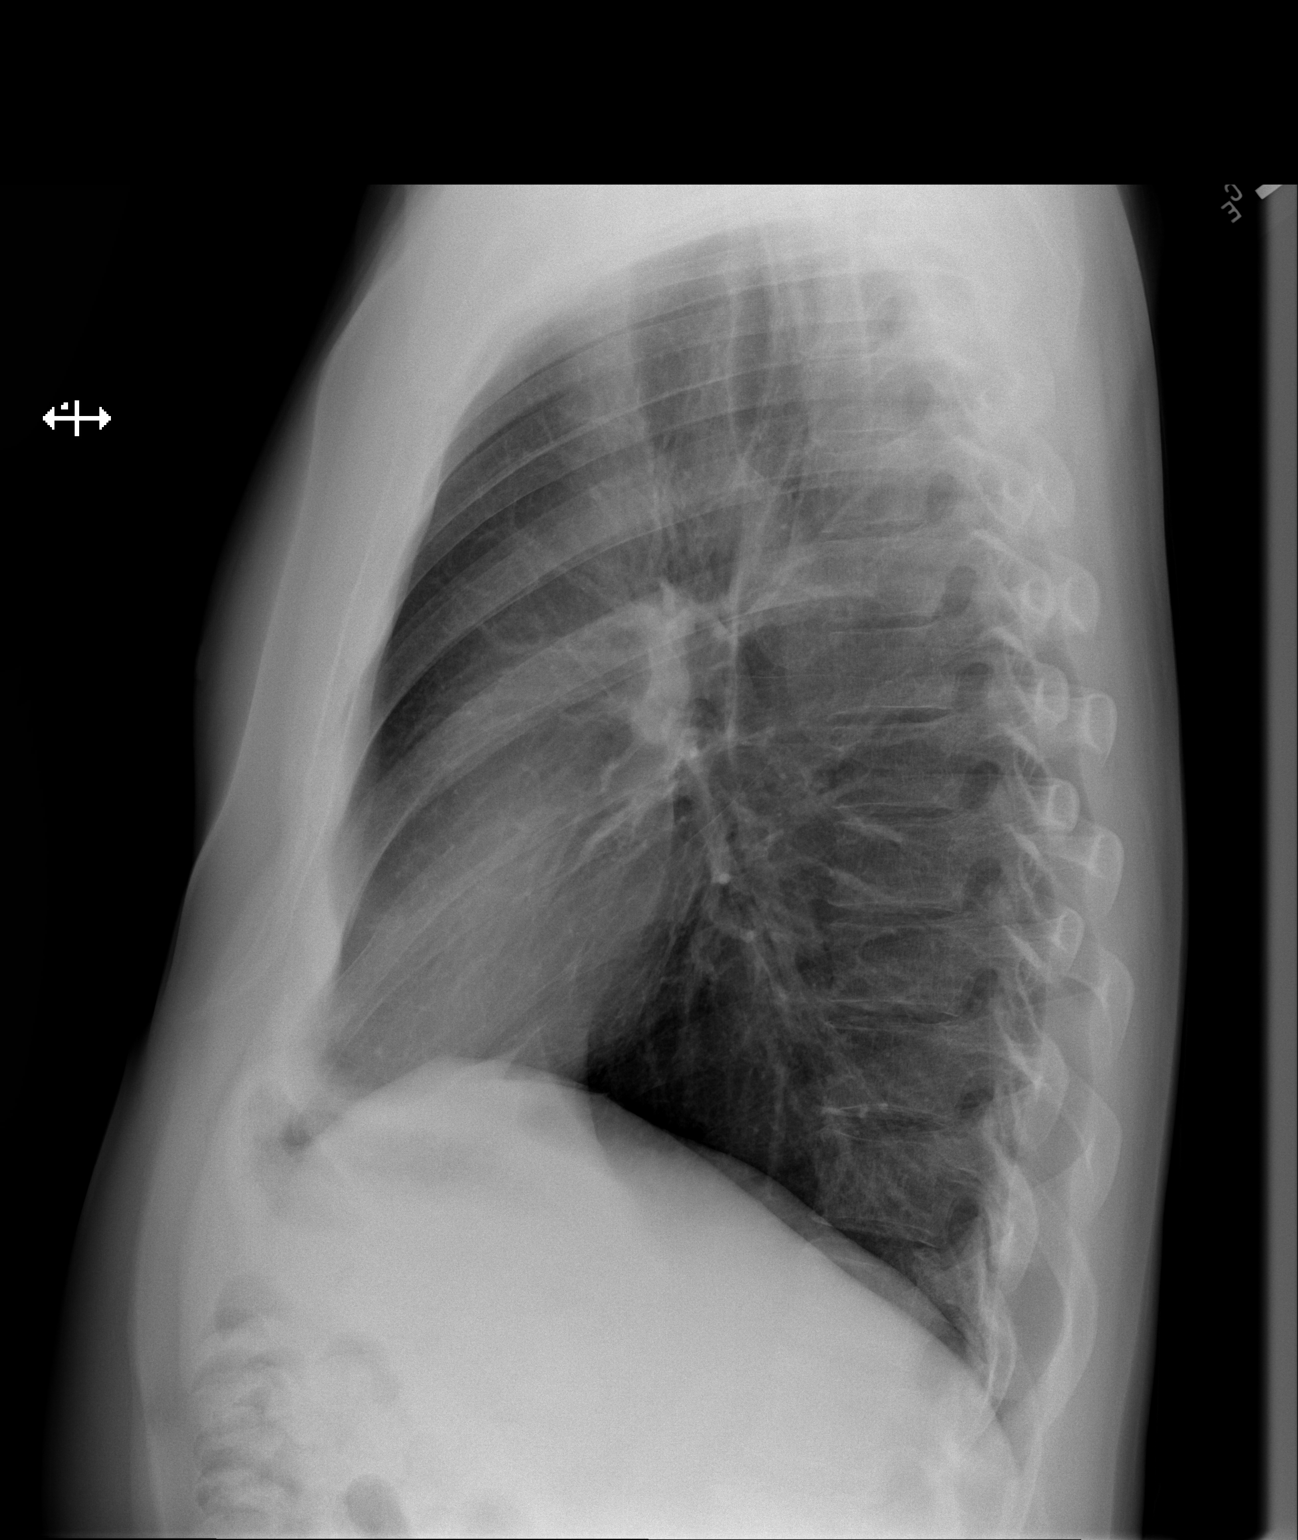

[2 of 2 positions shown; findings below may reference images not displayed]

FINDINGS: The heart size and mediastinal contours are within normal limits.
Both lungs are clear. The visualized skeletal structures are
unremarkable.
IMPRESSION: No active cardiopulmonary disease.

## 2015-12-14 MED ORDER — ALBUTEROL SULFATE (2.5 MG/3ML) 0.083% IN NEBU
INHALATION_SOLUTION | RESPIRATORY_TRACT | Status: AC
  Filled 2015-12-14: qty 3

## 2015-12-14 MED ORDER — ALBUTEROL SULFATE (2.5 MG/3ML) 0.083% IN NEBU: 2.5000 mg | INHALATION_SOLUTION | Freq: Four times a day (QID) | RESPIRATORY_TRACT | 1 refills | Status: AC | PRN

## 2015-12-14 MED ORDER — METHYLPREDNISOLONE SODIUM SUCC 125 MG IJ SOLR
125.0000 mg | Freq: Once | INTRAMUSCULAR | Status: AC
  Administered 2015-12-14: 125 mg via INTRAVENOUS
  Filled 2015-12-14: qty 2

## 2015-12-14 MED ORDER — IPRATROPIUM-ALBUTEROL 0.5-2.5 (3) MG/3ML IN SOLN
RESPIRATORY_TRACT | Status: AC
  Administered 2015-12-14: 3 mL via RESPIRATORY_TRACT
  Filled 2015-12-14: qty 3

## 2015-12-14 MED ORDER — ALBUTEROL SULFATE (2.5 MG/3ML) 0.083% IN NEBU
5.0000 mg | INHALATION_SOLUTION | Freq: Once | RESPIRATORY_TRACT | Status: AC
  Administered 2015-12-14: 5 mg via RESPIRATORY_TRACT

## 2015-12-14 MED ORDER — ALBUTEROL SULFATE HFA 108 (90 BASE) MCG/ACT IN AERS: 2.0000 | INHALATION_SPRAY | Freq: Four times a day (QID) | RESPIRATORY_TRACT | 2 refills | Status: AC | PRN

## 2015-12-14 MED ORDER — IPRATROPIUM-ALBUTEROL 0.5-2.5 (3) MG/3ML IN SOLN
3.0000 mL | Freq: Once | RESPIRATORY_TRACT | Status: AC
  Administered 2015-12-14: 3 mL via RESPIRATORY_TRACT
  Filled 2015-12-14: qty 3

## 2015-12-14 MED ORDER — METHYLPREDNISOLONE SODIUM SUCC 125 MG IJ SOLR
INTRAMUSCULAR | Status: AC
  Administered 2015-12-14: 125 mg via INTRAVENOUS
  Filled 2015-12-14: qty 2

## 2015-12-14 MED ORDER — PREDNISONE 50 MG PO TABS: 50.0000 mg | ORAL_TABLET | Freq: Every day | ORAL | 0 refills | Status: DC

## 2015-12-14 NOTE — ED Triage Notes (Addendum)
States nasal congestion and cough, states he was recently tx for bronchitis with steriods, hx of chronic bronchitis and smoking, states doing breathing tx at home with no relief

## 2015-12-14 NOTE — ED Provider Notes (Signed)
[facility] Emergency Department Provider Note   ____________________________________________    I have reviewed the triage vital signs and the nursing notes.   HISTORY  Chief Complaint Cough and Shortness of Breath     HPI Adam Newman is a 50 y.o. male who presents with complaints of shortness of breath and cough. Patient reports he has felt short of breath over the last 2 weeks, he has woken up many times at night and need to use a nebulizer although he reports he does not seem to significantly help. It does seem to get worse when he lies down. He denies chest pain. He does report cough which is nonproductive. He took a steroid taper which did not seem to help. He has been seen in urgent care twice in the last 2 weeks. No fevers or chills. No diaphoresis he reports normal stress test in the past. He does smoke cigarettes.   Past Medical History:  Diagnosis Date  . Asthma     There are no active problems to display for this patient.   History reviewed. No pertinent surgical history.  Prior to Admission medications   Medication Sig Start Date End Date Taking? Authorizing Provider  fexofenadine (ALLEGRA) 180 MG tablet Take 1 tablet by mouth daily.    Historical Provider, MD  fluticasone (FLONASE) 50 MCG/ACT nasal spray Place 2 sprays into the nose daily.    Historical Provider, MD  Fluticasone-Salmeterol (ADVAIR) 100-50 MCG/DOSE AEPB Inhale 1 puff into the lungs 2 (two) times daily.    Historical Provider, MD     Allergies Penicillins  History reviewed. No pertinent family history.  Social History Social History  Substance Use Topics  . Smoking status: Current Some Day Smoker  . Smokeless tobacco: Not on file  . Alcohol use Not on file    Review of Systems  Constitutional: No fever/chills  ENT: NoThroat swelling Cardiovascular: Denies chest pain. Respiratory: As above Gastrointestinal: No abdominal pain.   Musculoskeletal:  Negative for back pain. Skin: Negative for rash. Neurological: Negative for headaches  10-point ROS otherwise negative.  ____________________________________________   PHYSICAL EXAM:  VITAL SIGNS: ED Triage Vitals  Enc Vitals Group     BP 12/14/15 1308 (!) 127/97     Pulse Rate 12/14/15 1308 (!) 105     Resp 12/14/15 1308 18     Temp 12/14/15 1308 97.7 F (36.5 C)     Temp Source 12/14/15 1308 Oral     SpO2 12/14/15 1308 99 %     Weight 12/14/15 1309 160 lb (72.6 kg)     Height 12/14/15 1309 5\' 8"  (1.727 m)     Head Circumference --      Peak Flow --      Pain Score 12/14/15 1309 0     Pain Loc --      Pain Edu? --      Excl. in GC? --     Constitutional: Alert and oriented. No acute distress.  Eyes: Conjunctivae are normal.  Head: Atraumatic. Nose: No congestion/rhinnorhea. Mouth/Throat: Mucous membranes are moist.   Neck:  Painless ROM Cardiovascular: Tachycardia, regular rhythm. Grossly normal heart sounds.  Good peripheral circulation. Respiratory: Mild tachypnea noted on my exam.  No retractions. Scattered mild wheezes Gastrointestinal: Soft and nontender. No distention.  No CVA tenderness. Genitourinary: deferred Musculoskeletal: No lower extremity tenderness nor edema.  Warm and well perfused Neurologic:  Normal speech and language. No gross focal neurologic deficits are appreciated.  Skin:  Skin is warm, dry and intact. No rash noted. Psychiatric: Mood and affect are normal. Speech and behavior are normal.  ____________________________________________   LABS (all labs ordered are listed, but only abnormal results are displayed)  Labs Reviewed  CBC WITH DIFFERENTIAL/PLATELET  COMPREHENSIVE METABOLIC PANEL  BRAIN NATRIURETIC PEPTIDE  FIBRIN DERIVATIVES D-DIMER ([facility] ONLY)   ____________________________________________  EKG  ED ECG REPORT I, Jene EveryKINNER, Kirsi Hugh, the attending physician, personally viewed and interpreted this ECG.  Date: 12/14/2015 EKG  Time: 2:11 PM Rate: 82 Rhythm: normal sinus rhythm QRS Axis: normal Intervals: normal ST/T Wave abnormalities: normal Conduction Disturbances: none Narrative Interpretation: unremarkable  ____________________________________________  RADIOLOGY  Chest x-ray unremarkable ____________________________________________   PROCEDURES  Procedure(s) performed: No    Critical Care performed: No ____________________________________________   INITIAL IMPRESSION / ASSESSMENT AND PLAN / ED COURSE  Pertinent labs & imaging results that were available during my care of the patient were reviewed by me and considered in my medical decision making (see chart for details).  Patient presents with cough and shortness of breath. He does smoke cigarettes. He does not have significant wheezes after DuoNeb. Suspect bronchitis with bronchospasm however we will check labs, EKG and reevaluate. IV Solu-Medrol given in the emergency department  Clinical Course  Value Comment By Time  Glucose: 93 (Reviewed) Jene Everyobert Taiten Brawn, MD 09/10 1600   ____________________________________________ Labwork unremarkable, patient feeling better after DuoNeb and Solu-Medrol. Suspect bronchitis with bronchospasm. We will treat with steroids, nebulizers. Did discuss return precautions  FINAL CLINICAL IMPRESSION(S) / ED DIAGNOSES  Final diagnoses:  Bronchospasm with bronchitis, acute      NEW MEDICATIONS STARTED DURING THIS VISIT:  New Prescriptions   No medications on file     Note:  This document was prepared using Dragon voice recognition software and may include unintentional dictation errors.    Jene Everyobert Baylon Santelli, MD 12/14/15 [phone]

## 2015-12-14 NOTE — ED Notes (Signed)
SOB with coughing. Pt states he gets bronchitis X 2 year and this feels the same. Pt alert and oriented X4, active, cooperative, pt in NAD. RR even and unlabored, color WNL.

## 2015-12-14 NOTE — ED Notes (Signed)
Attempt for IV access X 2 by this RN unsuccessful.

## 2015-12-14 NOTE — ED Notes (Signed)
Pt alert and oriented X4, active, cooperative, pt in NAD. RR even and unlabored, color WNL.  Pt informed to return if any life threatening symptoms occur.   

## 2015-12-14 NOTE — ED Notes (Signed)
MD at bedside. 

## 2016-04-07 ENCOUNTER — Other Ambulatory Visit
Admission: RE | Admit: 2016-04-07 | Discharge: 2016-04-07 | Disposition: A | Discharge status: Home / Self Care | Payer: BLUE CROSS/BLUE SHIELD | Source: Ambulatory Visit | Attending: Physician Assistant | Admitting: Physician Assistant

## 2016-04-07 DIAGNOSIS — R000 Tachycardia, unspecified: Secondary | ICD-10-CM | POA: Insufficient documentation

## 2016-04-07 DIAGNOSIS — R0602 Shortness of breath: Secondary | ICD-10-CM | POA: Insufficient documentation

## 2016-04-07 LAB — FIBRIN DERIVATIVES D-DIMER (ARMC ONLY): Fibrin derivatives D-dimer (ARMC): 225 (ref 0–499)

## 2018-01-10 ENCOUNTER — Other Ambulatory Visit: Payer: Self-pay | Admitting: Orthopedic Surgery

## 2018-01-10 DIAGNOSIS — M5440 Lumbago with sciatica, unspecified side: Secondary | ICD-10-CM

## 2019-08-29 ENCOUNTER — Other Ambulatory Visit: Payer: Self-pay | Admitting: Orthopedic Surgery

## 2019-08-29 DIAGNOSIS — M25312 Other instability, left shoulder: Secondary | ICD-10-CM

## 2019-08-29 DIAGNOSIS — S46002A Unspecified injury of muscle(s) and tendon(s) of the rotator cuff of left shoulder, initial encounter: Secondary | ICD-10-CM

## 2019-08-29 DIAGNOSIS — M25512 Pain in left shoulder: Secondary | ICD-10-CM

## 2019-09-24 ENCOUNTER — Ambulatory Visit
Admission: RE | Admit: 2019-09-24 | Discharge: 2019-09-24 | Disposition: A | Discharge status: Home / Self Care | Payer: BLUE CROSS/BLUE SHIELD | Source: Ambulatory Visit | Attending: Orthopedic Surgery | Admitting: Orthopedic Surgery

## 2019-09-24 ENCOUNTER — Other Ambulatory Visit: Payer: Self-pay

## 2019-09-24 DIAGNOSIS — S46002A Unspecified injury of muscle(s) and tendon(s) of the rotator cuff of left shoulder, initial encounter: Secondary | ICD-10-CM

## 2019-09-24 DIAGNOSIS — M25312 Other instability, left shoulder: Secondary | ICD-10-CM

## 2019-09-24 DIAGNOSIS — M25512 Pain in left shoulder: Secondary | ICD-10-CM

## 2019-09-24 IMAGING — MR MR SHOULDER*L* W/O CM
4 of 5 series · 26 of 40 positions shown · non-contrast
Comparison: None.

CLINICAL DATA: Left shoulder pain with limited range of motion for
2-3 years. No acute injury or prior relevant surgery.

EXAM:
MRI OF THE LEFT SHOULDER WITHOUT CONTRAST
TECHNIQUE: Multiplanar, multisequence MR imaging of the shoulder was performed.
No intravenous contrast was administered.

[Series 3: PD fat-sat · axial · 4.0mm · 0.27mm/px · z∈[-42,+56]mm · 9 of 22 slices shown (1 of 2)]
[im 1/22]
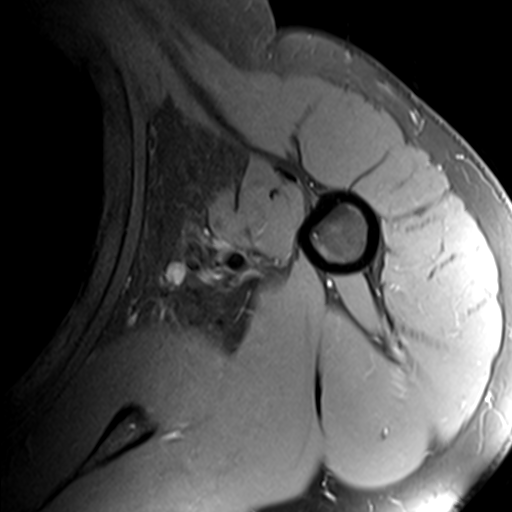
[im 3/22]
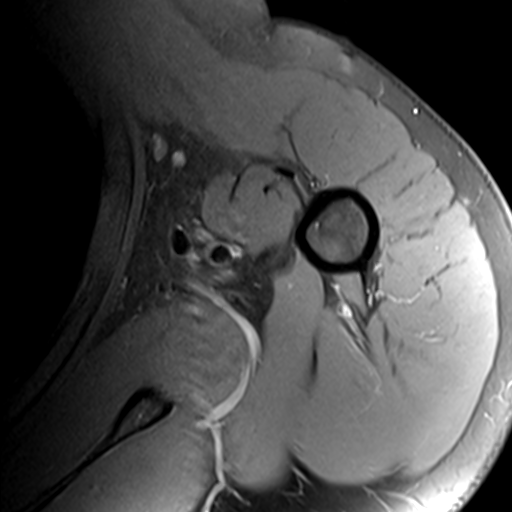
[im 6/22]
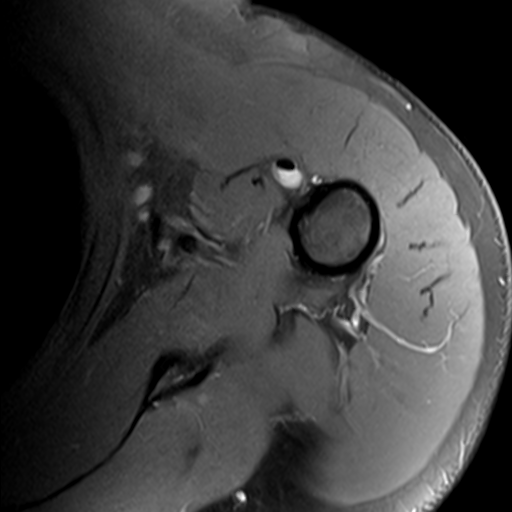
[im 8/22]
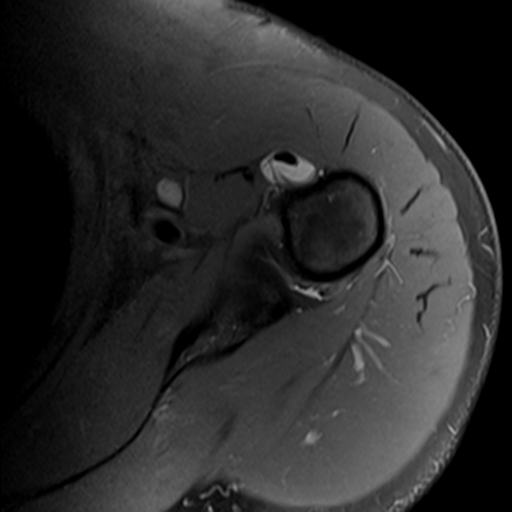
[im 11/22]
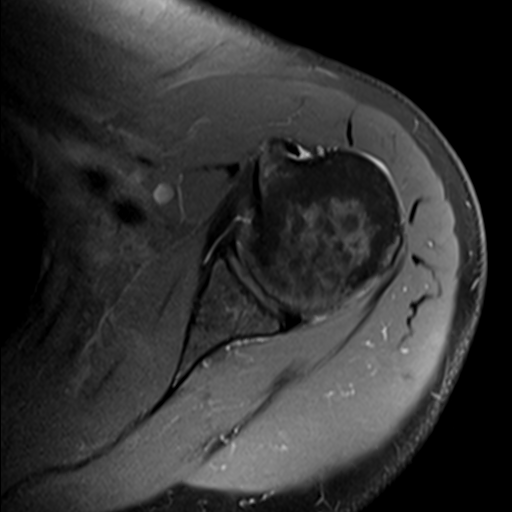
[im 14/22]
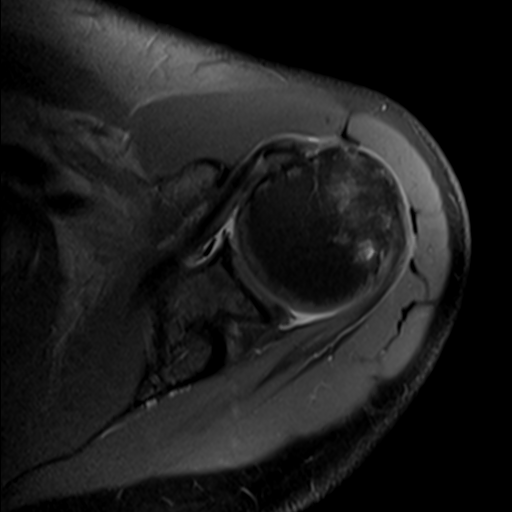
[im 16/22]
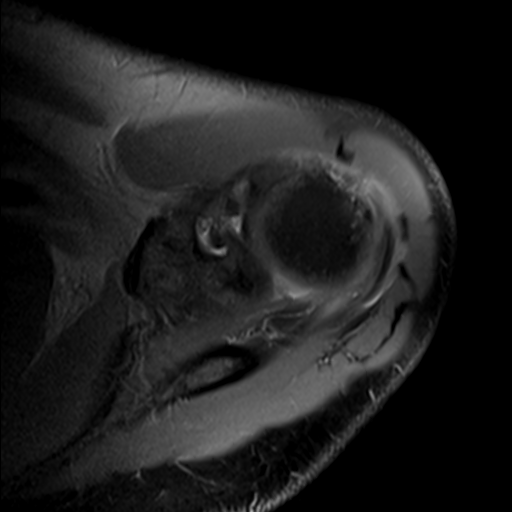
[im 19/22]
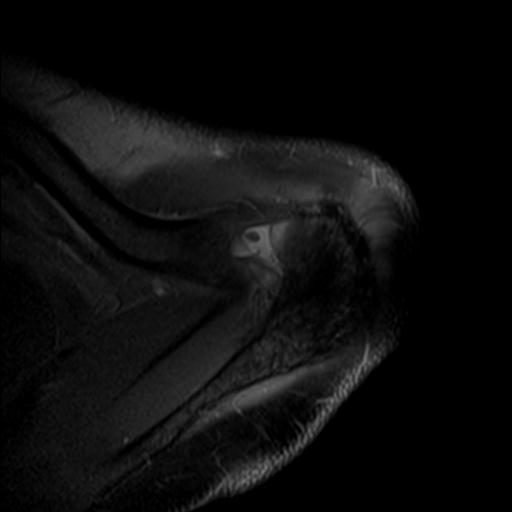
[im 22/22]
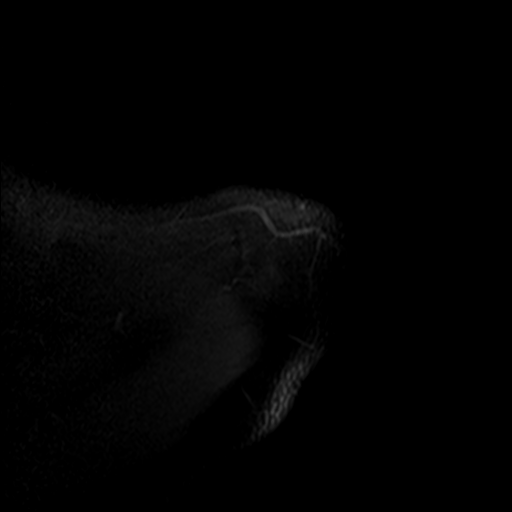

[Series 4: T2 fat-sat · oblique · 4.0mm · 0.55mm/px · 7 of 20 slices shown (1 of 2)]
[im 1/20]
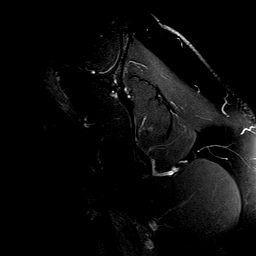
[im 3/20]
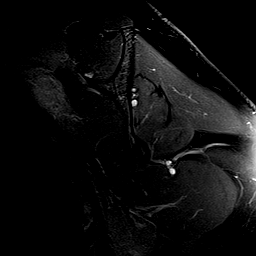
[im 6/20]
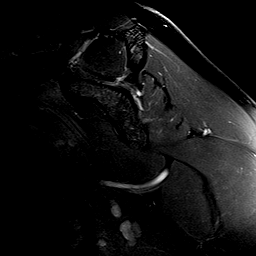
[im 9/20]
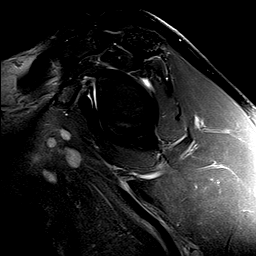
[im 11/20]
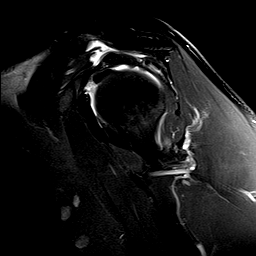
[im 14/20]
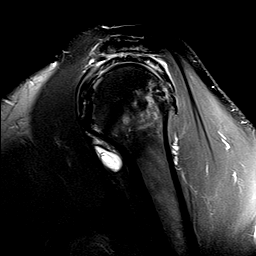
[im 17/20]
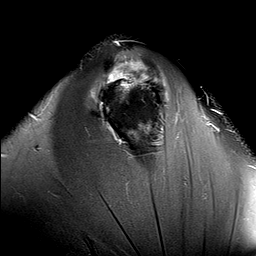

[Series 6: T2 fat-sat · oblique · 4.0mm · 0.55mm/px · 3 of 15 slices shown (2 of 2)]
[im 3/15]
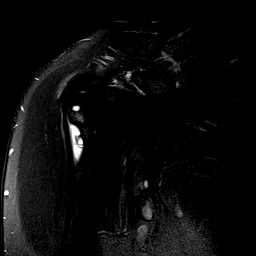
[im 8/15]
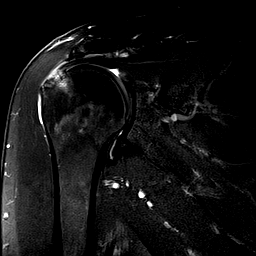
[im 12/15]
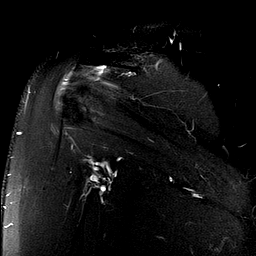

[Series 7: PD fat-sat · oblique · 4.0mm · 0.27mm/px · 7 of 15 slices shown (2 of 2)]
[im 1/15]
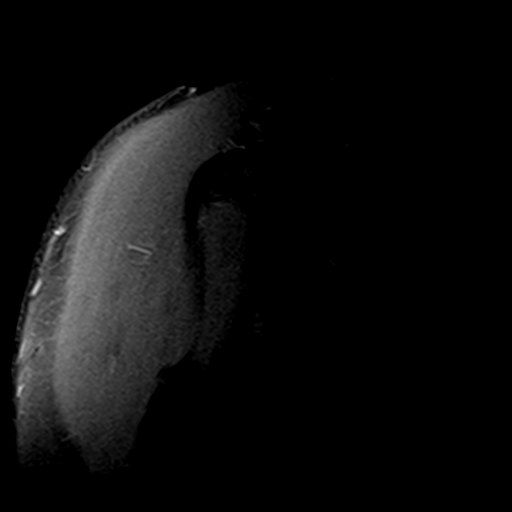
[im 3/15]
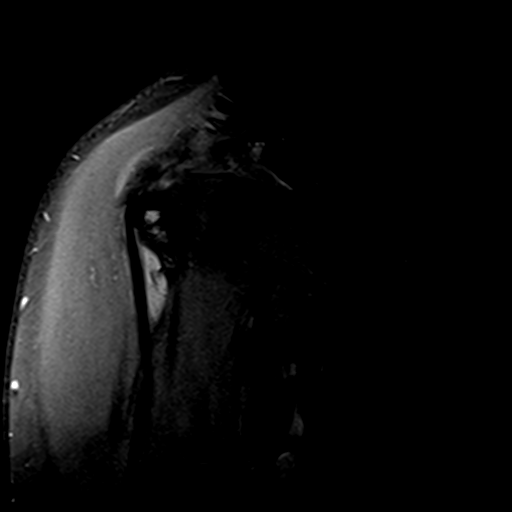
[im 5/15]
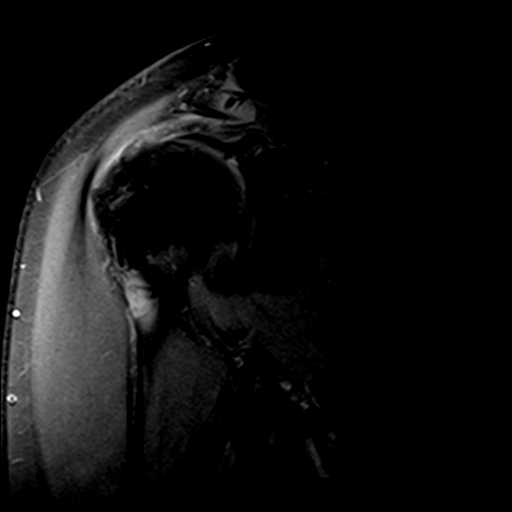
[im 8/15]
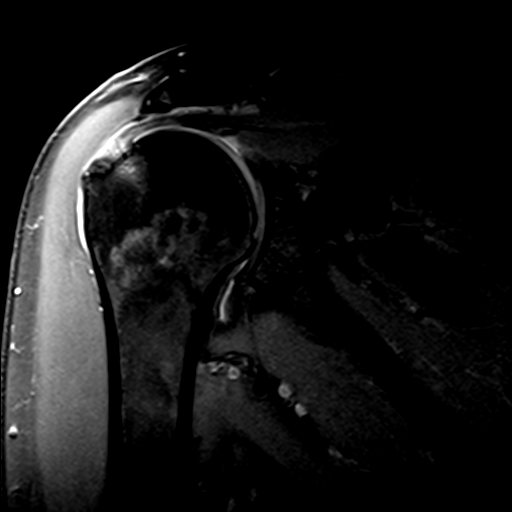
[im 10/15]
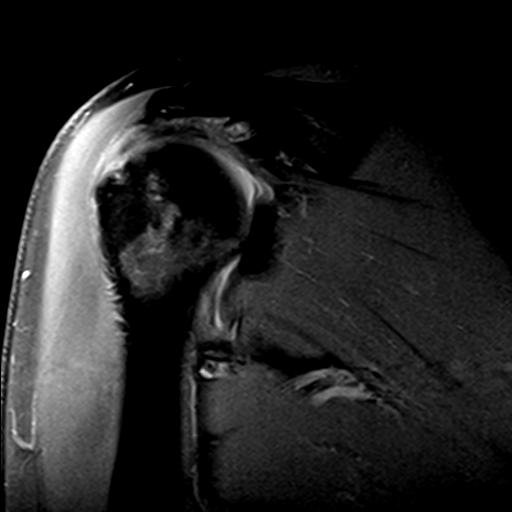
[im 12/15]
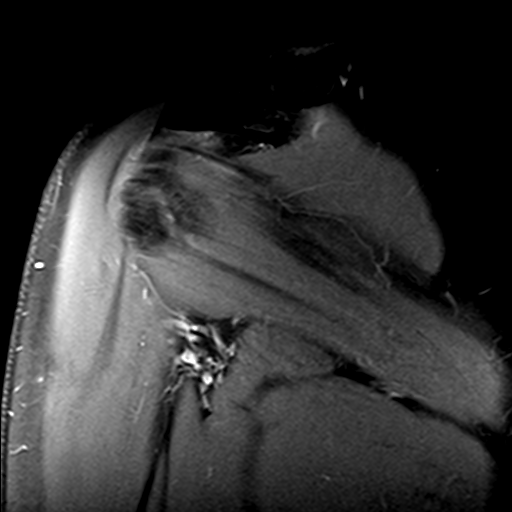
[im 15/15]
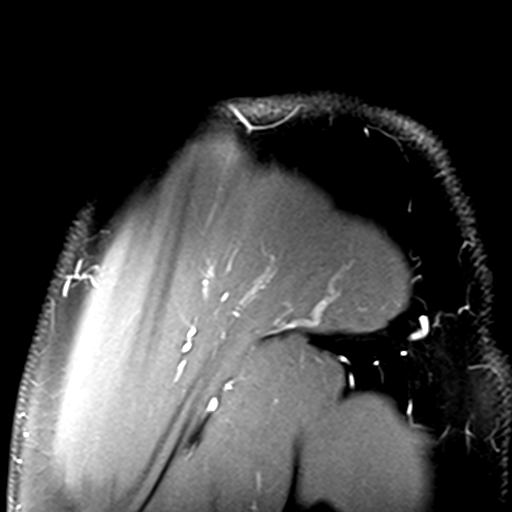

[26 of 40 positions shown; findings below may reference images not displayed]

FINDINGS: Rotator cuff: Supraspinatus tendinosis with at least high-grade
partial insertional tearing. The distal tendon is markedly
attenuated, and there may be a small full-thickness, nonretracted
tear (coronal image [DATE] and sagittal image [DATE]). Lesser
infraspinatus tendinosis and partial intrasubstance insertional
tearing with associated sentinel cyst formation. The subscapularis
and teres minor tendons appear normal.

Muscles:  No focal muscular atrophy or edema.

Biceps long head: Tendinosis of the intra-articular portion. The
tendon is normally positioned within the bicipital groove.

Acromioclavicular Joint: The acromion is type 1. There are mild
acromioclavicular degenerative changes. A small amount of fluid is
present in the subacromial-subdeltoid bursa.

Glenohumeral Joint: No significant shoulder joint effusion or
glenohumeral arthropathy.

Labrum:  No evidence of labral tear or paralabral cyst.

Bones: No acute or significant extra-articular osseous findings.

Other: No significant soft tissue findings.
IMPRESSION: 1. At least high-grade partial insertional tearing of the
supraspinatus tendon with possible small full-thickness,
nonretracted tear.
2. Lesser infraspinatus tendinosis and partial intrasubstance
insertional tearing with associated sentinel cyst formation.
3. Tendinosis of the intra-articular portion of the biceps tendon.
4. Mild acromioclavicular degenerative changes.

## 2019-10-23 ENCOUNTER — Other Ambulatory Visit: Payer: Self-pay | Admitting: Orthopedic Surgery

## 2019-10-23 DIAGNOSIS — M4316 Spondylolisthesis, lumbar region: Secondary | ICD-10-CM

## 2019-11-07 ENCOUNTER — Ambulatory Visit
Admission: RE | Admit: 2019-11-07 | Discharge: 2019-11-07 | Disposition: A | Discharge status: Home / Self Care | Payer: 59 | Source: Ambulatory Visit | Attending: Orthopedic Surgery | Admitting: Orthopedic Surgery

## 2019-11-07 ENCOUNTER — Other Ambulatory Visit: Payer: Self-pay

## 2019-11-07 DIAGNOSIS — M4316 Spondylolisthesis, lumbar region: Secondary | ICD-10-CM | POA: Insufficient documentation

## 2019-11-07 IMAGING — MR MR LUMBAR SPINE W/O CM
5 series · 30 of 48 positions shown · non-contrast
Comparison: No pertinent prior exams are available for comparison.

CLINICAL DATA: Spondylolisthesis of lumbar region. Additional
history provided by scanning technologist: Patient reports low back
pain with pain in both legs, burning in bottoms of feet. Symptoms
for many years.

EXAM:
MRI LUMBAR SPINE WITHOUT CONTRAST
TECHNIQUE: Multiplanar, multisequence MR imaging of the lumbar spine was
performed. No intravenous contrast was administered.

[Series 5: T2 · sagittal · 4.0mm · 0.81mm/px · 6 of 17 slices shown (1 of 2)]
[im 1/17]
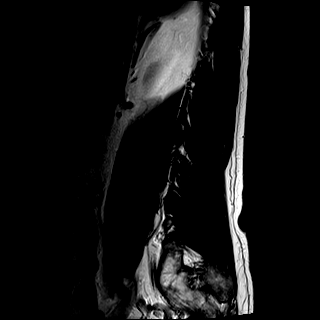
[im 4/17]
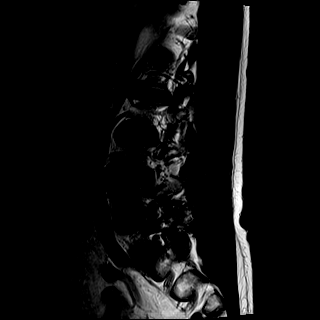
[im 7/17]
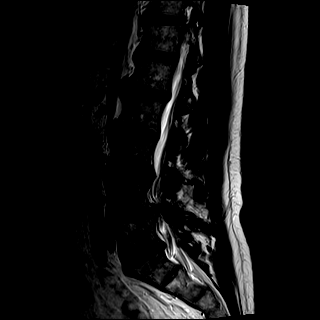
[im 10/17]
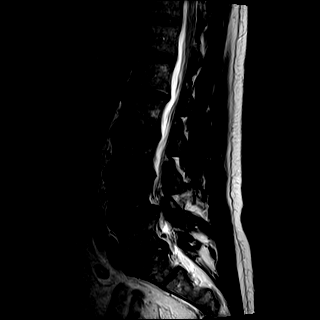
[im 13/17]
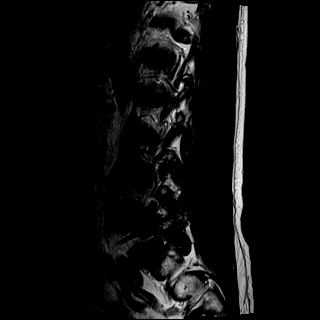
[im 17/17]
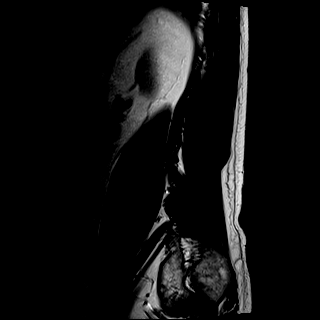

[Series 6: T1 · sagittal · 4.0mm · 0.81mm/px · 7 of 17 slices shown (1 of 2)]
[im 1/17]
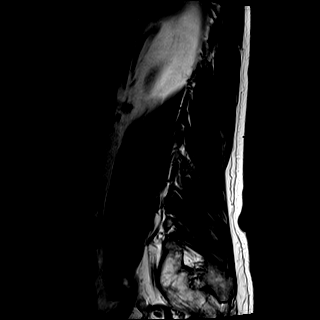
[im 3/17]
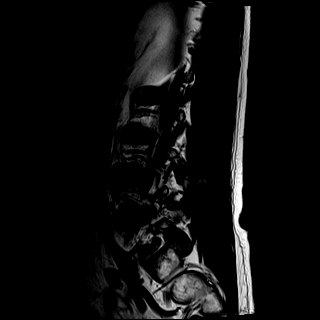
[im 6/17]
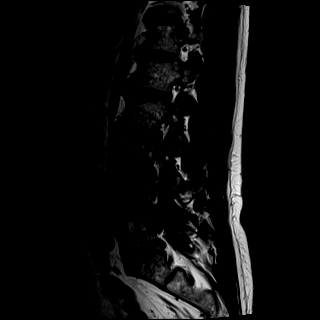
[im 9/17]
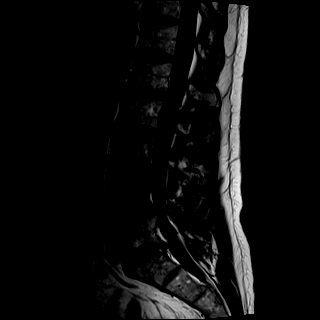
[im 11/17]
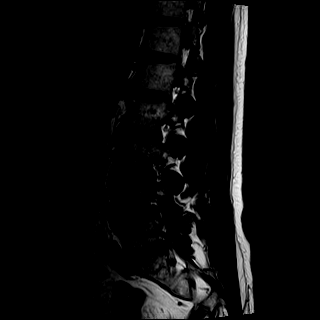
[im 14/17]
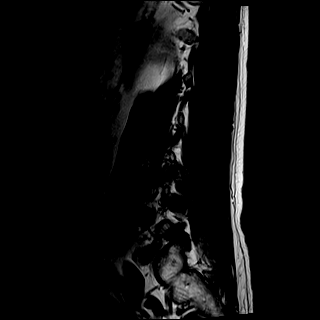
[im 17/17]
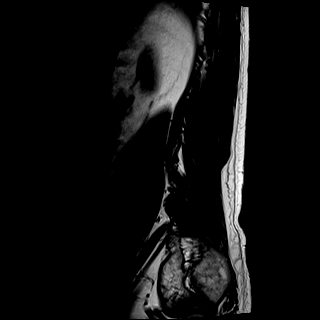

[Series 7: STIR · sagittal · 4.0mm · 0.41mm/px · 1 of 17 slices shown]
[im 1/17]
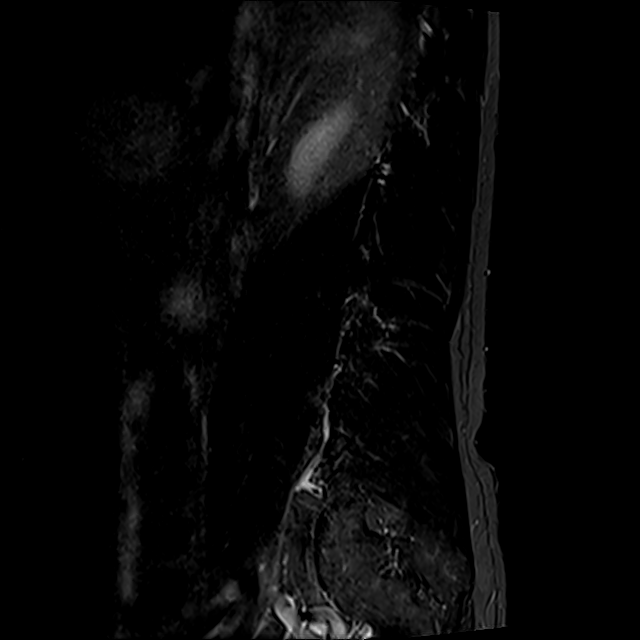

[Series 8: T2 · axial · 4.0mm · 0.78mm/px · z∈[-229,-24]mm · 8 of 37 slices shown (2 of 2)]
[im 1/37]
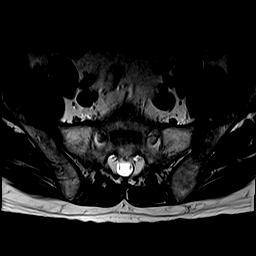
[im 6/37]
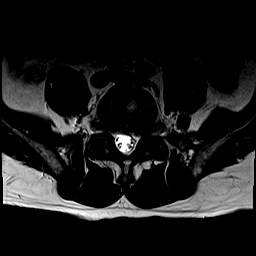
[im 12/37]
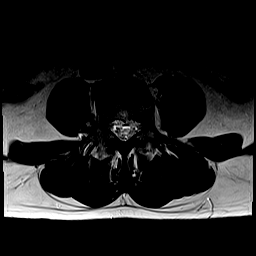
[im 17/37]
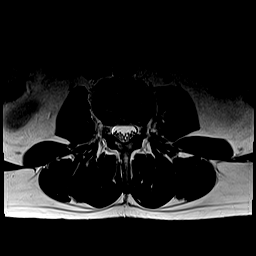
[im 20/37]
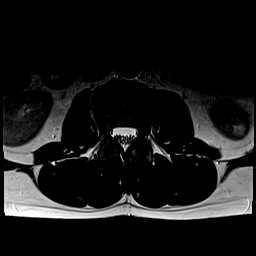
[im 25/37]
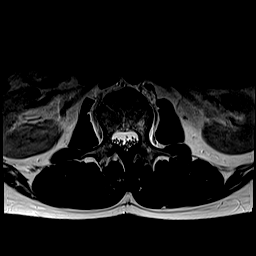
[im 31/37]
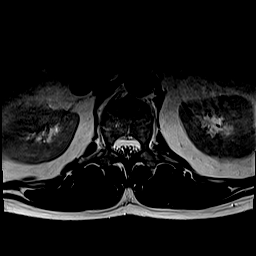
[im 37/37]
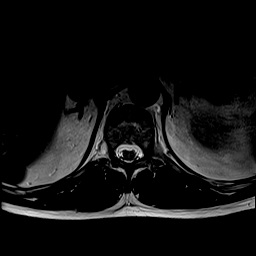

[Series 9: T1 · axial · 4.0mm · 0.39mm/px · z∈[-229,-24]mm · 8 of 37 slices shown (2 of 2)]
[im 1/37]
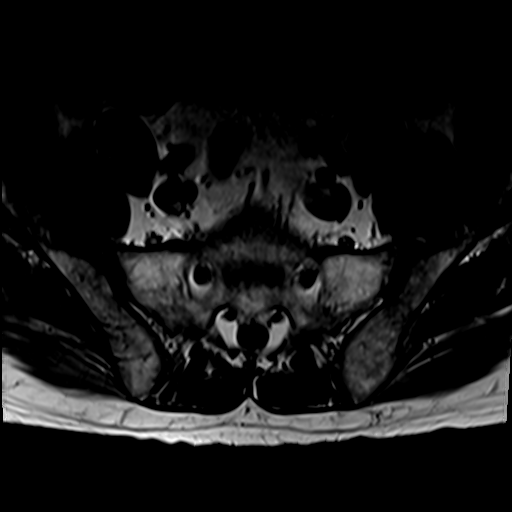
[im 6/37]
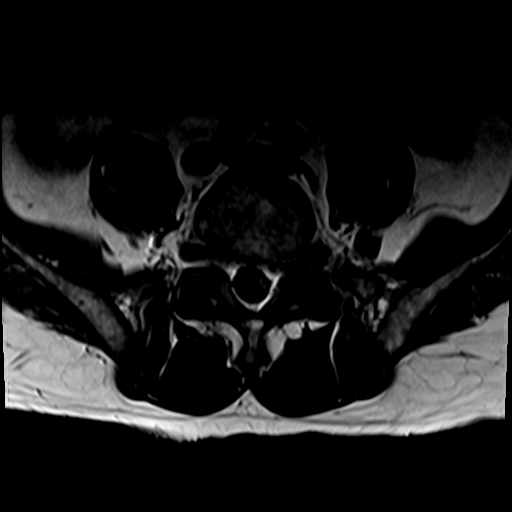
[im 12/37]
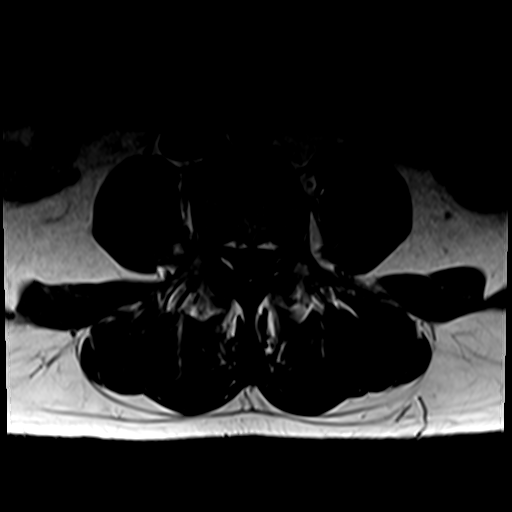
[im 17/37]
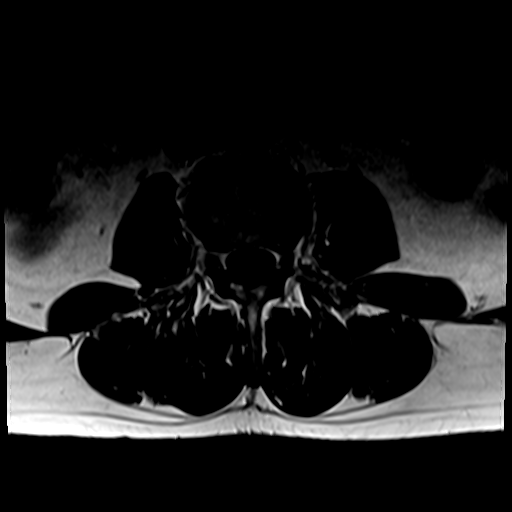
[im 20/37]
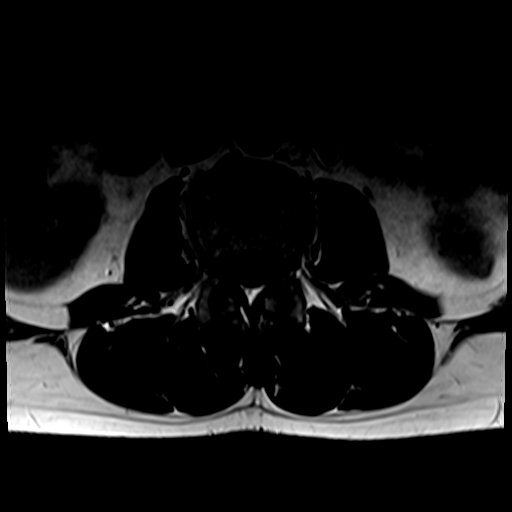
[im 25/37]
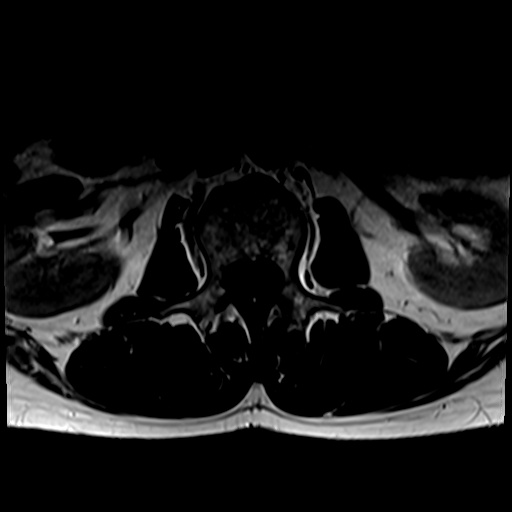
[im 31/37]
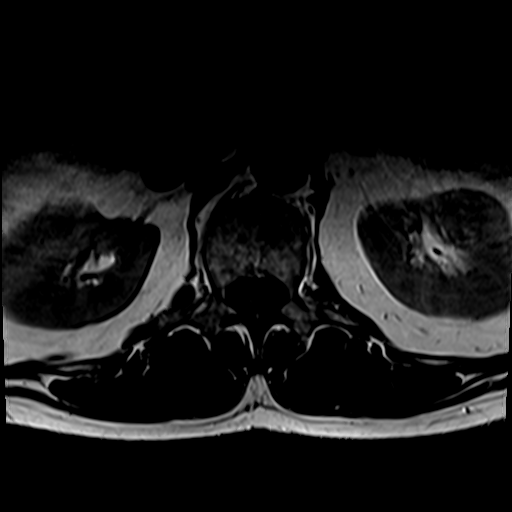
[im 37/37]
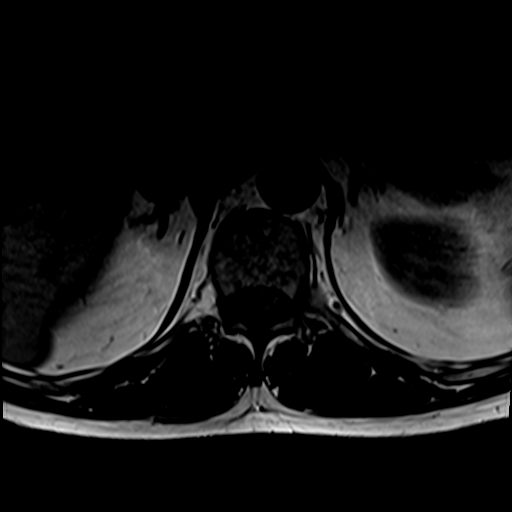

[30 of 48 positions shown; findings below may reference images not displayed]

FINDINGS: Segmentation: For the purposes of this dictation, five lumbar
vertebrae are assumed and the caudal most well-formed intervertebral
disc is designated L5-S1.

Alignment: 4 mm L4-L5 grade 1 anterolisthesis. Mild grade 1
retrolisthesis is present at L1-L2.

Vertebrae: Vertebral body height is maintained. There is moderate
degenerative edema at L4-L5. Additionally, edema is present within
the bilateral L5 pedicles which is likely degenerative, but could
also reflect a component of stress reaction.

Conus medullaris and cauda equina: Conus extends to the L1-L2 level.
No signal abnormality within the visualized distal spinal cord.

Paraspinal and other soft tissues: Incompletely assessed left lower
pole renal cyst. Paraspinal soft tissues within normal limits.

Disc levels:

Multilevel disc degeneration. Most notably, there is
moderate/advanced disc degeneration at L4-L5 and moderate disc
degeneration at L5-S1.

T12-L1: No significant disc herniation or stenosis.

L1-L2: Mild grade 1 retrolisthesis. Disc uncovering with slight disc
bulge. No significant spinal canal stenosis or neural foraminal
narrowing.

L2-L3: No significant disc herniation or stenosis.

L3-L4: Small disc bulge with minimal endplate spurring. Mild
bilateral neural foraminal narrowing.

L4-L5: 4 mm grade 1 anterolisthesis. Disc uncovering with disc
bulge. Moderate/advanced facet arthrosis with ligamentum flavum
hypertrophy. Small bilateral facet joint effusions. Severe bilateral
subarticular and central canal stenosis. There is redundancy of the
cauda equina nerve roots cephalad to this level. Bilateral neural
foraminal narrowing (moderate/severe right, moderate left).

L5-S1: Disc bulge with endplate spurring. Superimposed small left
subarticular disc protrusion (series 5, image 10). Mild/moderate
facet arthrosis. Mild left subarticular narrowing without nerve root
impingement. Central canal patent. Bilateral neural foraminal
narrowing (mild/moderate right, moderate/severe left).
IMPRESSION: Lumbar spondylosis as outlined and most notably as follows.

At L4-L5, there is 4 mm grade 1 anterolisthesis. Moderate/advanced
disc degeneration with moderate degenerative endplate edema.
Moderate/advanced facet arthrosis with ligamentum flavum
hypertrophy. Small bilateral facet joint effusions. Severe
subarticular and central canal stenosis. Redundancy of the cauda
equina nerve roots cephalad to this level. Bilateral neural
foraminal narrowing (moderate/severe right, moderate left).

At L5-S1, there is moderate disc degeneration. Disc bulge with
superimposed small left subarticular disc protrusion. Mild/moderate
facet arthrosis. Minimal left subarticular narrowing without nerve
root impingement. Bilateral neural foraminal narrowing
(mild/moderate right, moderate/severe left).

Edema within the bilateral L5 pedicles which is likely degenerative,
but could also reflect a component of stress reaction.

## 2020-08-02 ENCOUNTER — Emergency Department
Admission: EM | Admit: 2020-08-02 | Discharge: 2020-08-02 | Disposition: A | Discharge status: Home / Self Care | Payer: 59 | Attending: Emergency Medicine | Admitting: Emergency Medicine

## 2020-08-02 ENCOUNTER — Emergency Department: Payer: 59

## 2020-08-02 ENCOUNTER — Other Ambulatory Visit: Payer: Self-pay

## 2020-08-02 DIAGNOSIS — S81022A Laceration with foreign body, left knee, initial encounter: Secondary | ICD-10-CM | POA: Insufficient documentation

## 2020-08-02 DIAGNOSIS — J45909 Unspecified asthma, uncomplicated: Secondary | ICD-10-CM | POA: Diagnosis not present

## 2020-08-02 DIAGNOSIS — Z79899 Other long term (current) drug therapy: Secondary | ICD-10-CM | POA: Insufficient documentation

## 2020-08-02 DIAGNOSIS — Y9389 Activity, other specified: Secondary | ICD-10-CM | POA: Insufficient documentation

## 2020-08-02 DIAGNOSIS — S8992XA Unspecified injury of left lower leg, initial encounter: Secondary | ICD-10-CM | POA: Diagnosis present

## 2020-08-02 DIAGNOSIS — S81011A Laceration without foreign body, right knee, initial encounter: Secondary | ICD-10-CM

## 2020-08-02 DIAGNOSIS — F17290 Nicotine dependence, other tobacco product, uncomplicated: Secondary | ICD-10-CM | POA: Insufficient documentation

## 2020-08-02 DIAGNOSIS — Z23 Encounter for immunization: Secondary | ICD-10-CM | POA: Insufficient documentation

## 2020-08-02 DIAGNOSIS — W25XXXA Contact with sharp glass, initial encounter: Secondary | ICD-10-CM | POA: Diagnosis not present

## 2020-08-02 DIAGNOSIS — Y92814 Boat as the place of occurrence of the external cause: Secondary | ICD-10-CM | POA: Insufficient documentation

## 2020-08-02 IMAGING — DX DG KNEE COMPLETE 4+V*L*
4 series · 4 of 4 positions shown · non-contrast
Comparison: None.

CLINICAL DATA: Initial evaluation for acute trauma, laceration.

EXAM:
LEFT KNEE - COMPLETE 4+ VIEW

[knee ap]
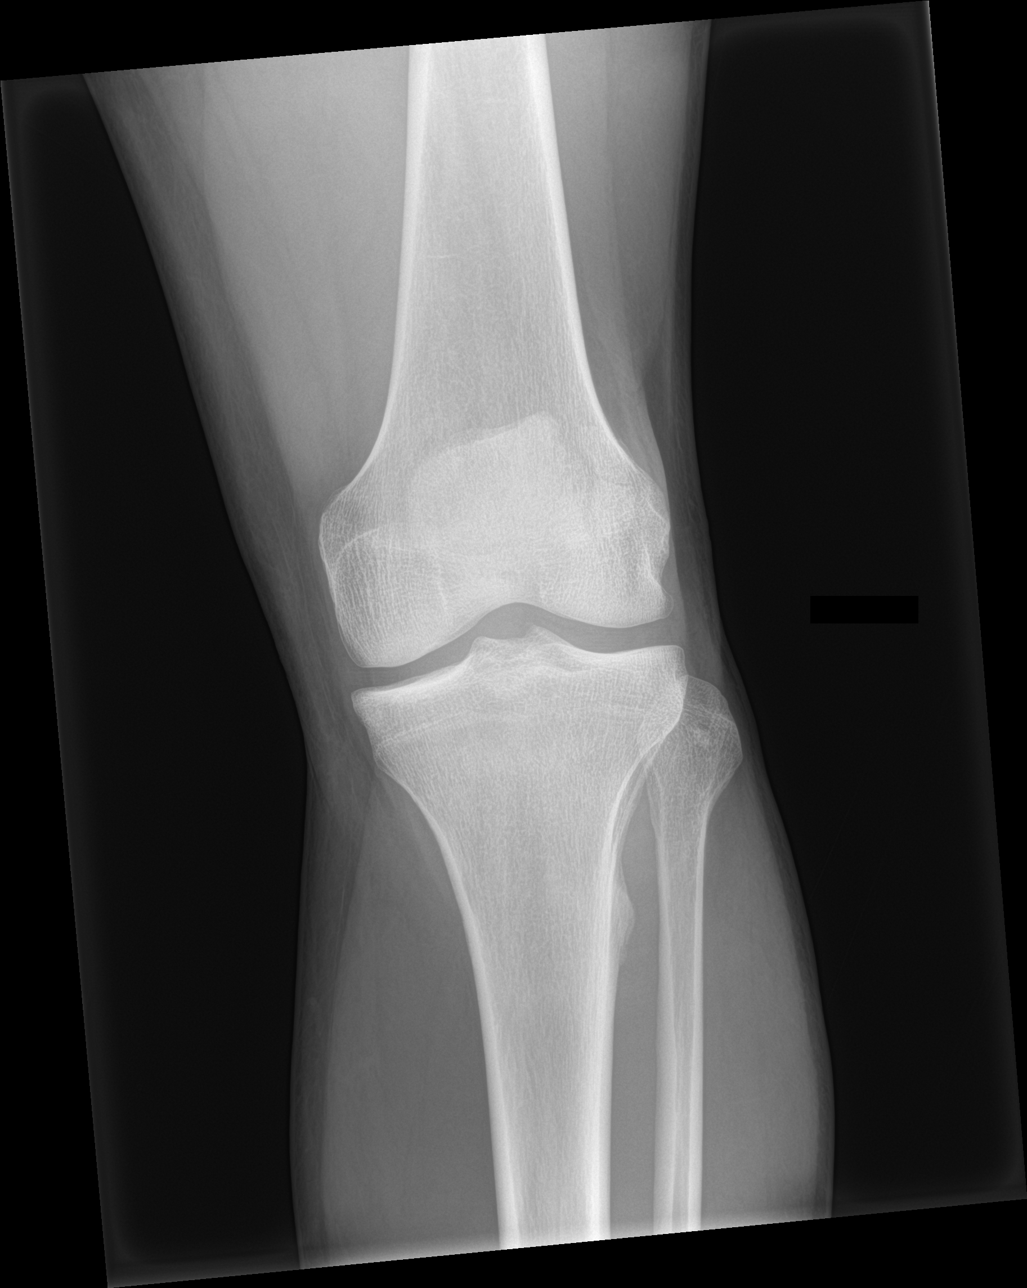

[knee obl (1 of 2)]
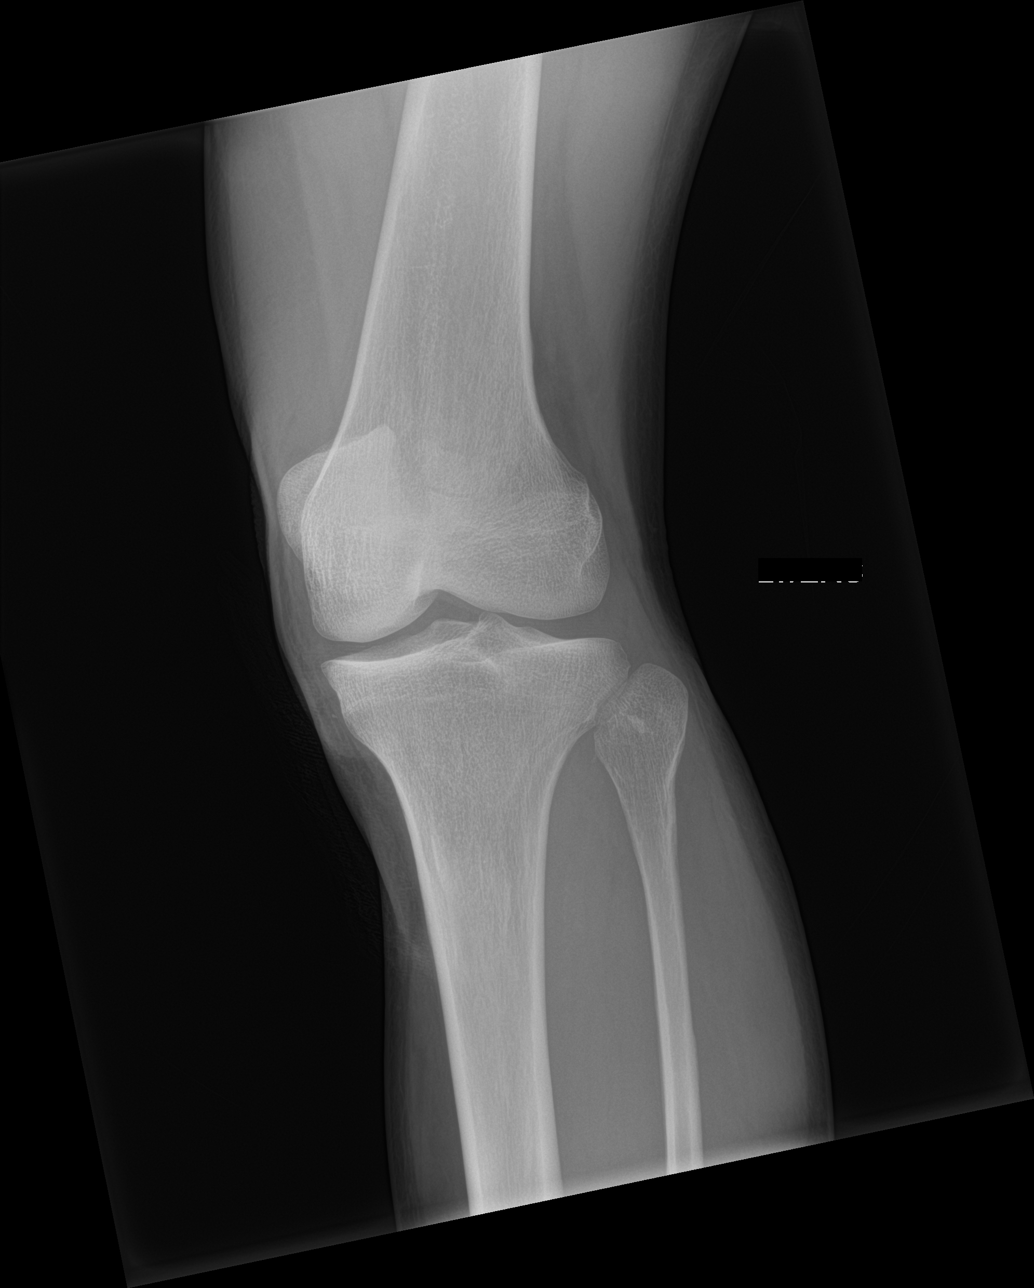

[knee lat]
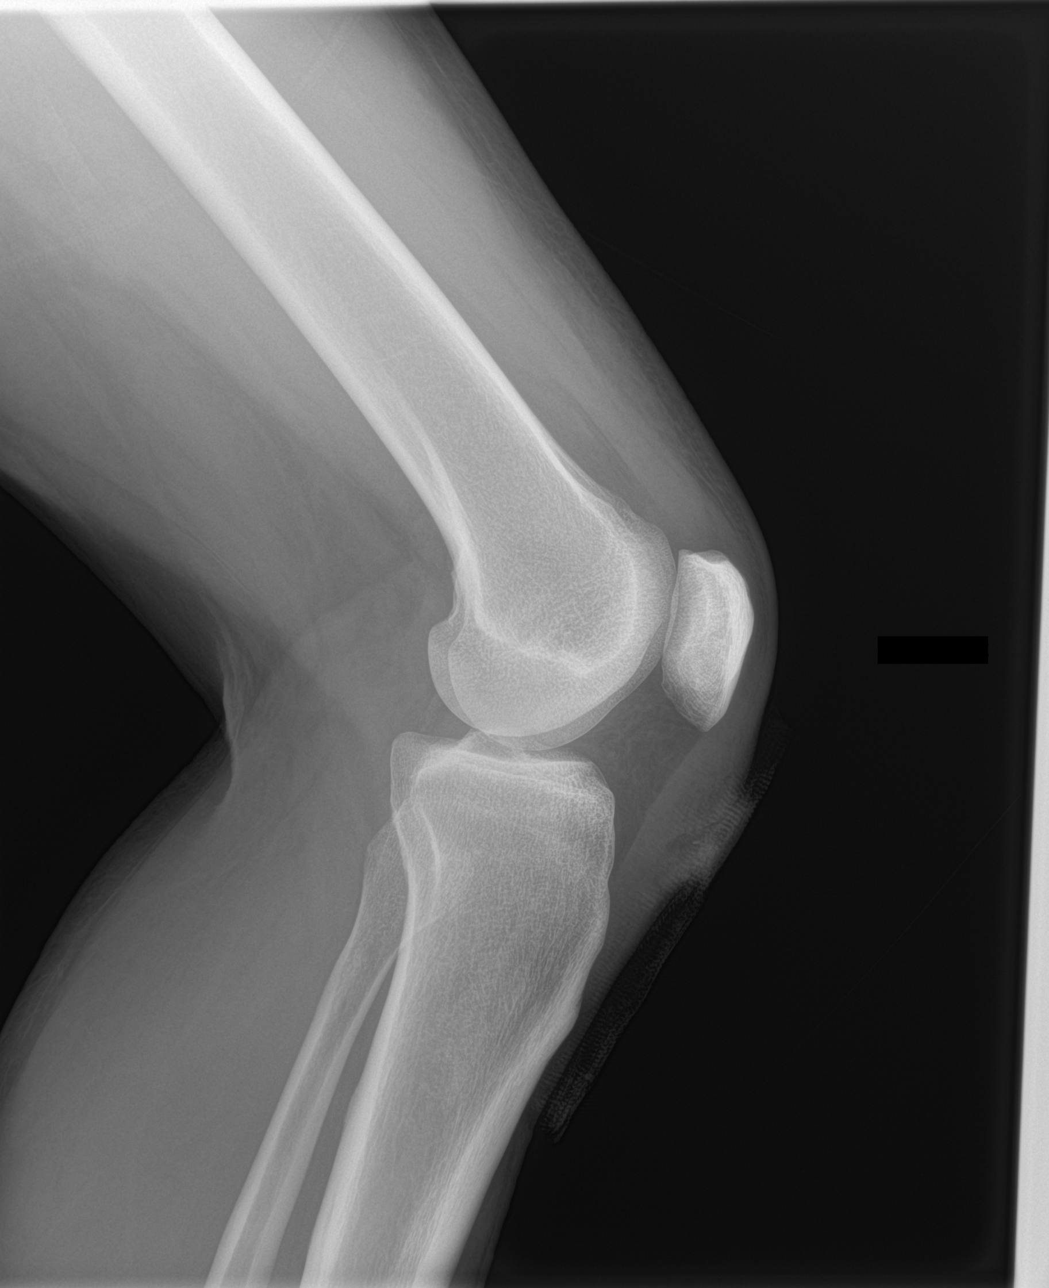

[knee obl (2 of 2)]
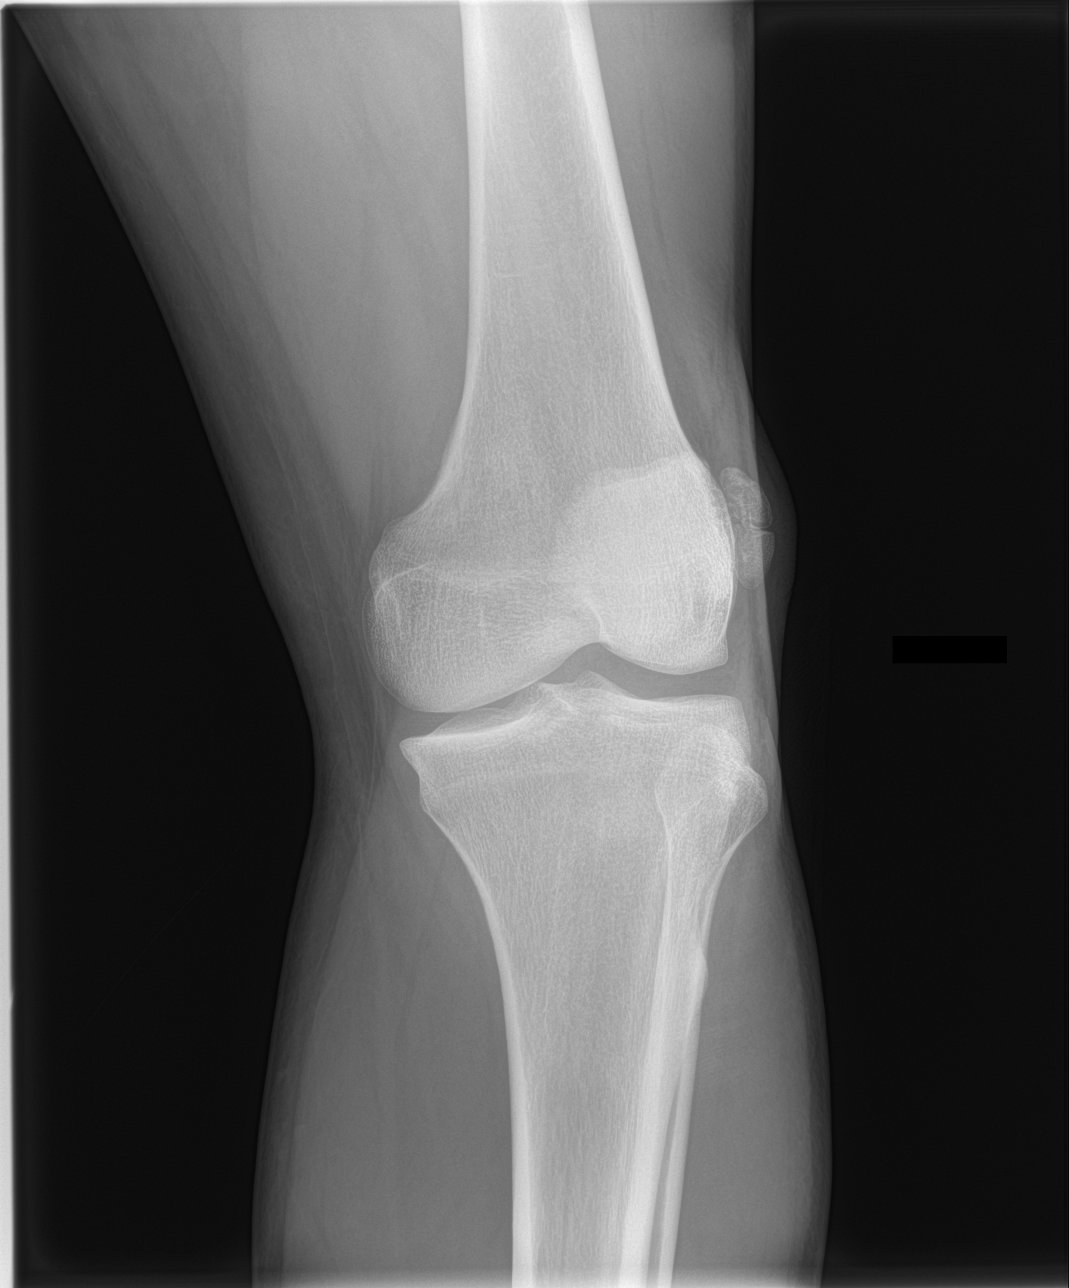

[4 of 4 positions shown; findings below may reference images not displayed]

FINDINGS: Soft tissue laceration present at the infrapatellar region of the
anterior left knee. Overlying bandaging material. Superimposed 3 mm
linear density suspicious for a small radiopaque foreign body (seen
only on lateral view). No acute fracture or dislocation. No joint
effusion. Bipartite patella noted.
IMPRESSION: 1. Soft tissue laceration at the infrapatellar region of the
anterior left knee. Superimposed 3 mm linear density suspicious for
a small radiopaque foreign body.
2. No acute fracture or dislocation.

## 2020-08-02 MED ORDER — SULFAMETHOXAZOLE-TRIMETHOPRIM 800-160 MG PO TABS: 1.0000 | ORAL_TABLET | Freq: Two times a day (BID) | ORAL | 0 refills | Status: DC

## 2020-08-02 MED ORDER — LIDOCAINE HCL (PF) 1 % IJ SOLN
5.0000 mL | Freq: Once | INTRAMUSCULAR | Status: AC
  Administered 2020-08-02: 5 mL
  Filled 2020-08-02: qty 5

## 2020-08-02 MED ORDER — SULFAMETHOXAZOLE-TRIMETHOPRIM 800-160 MG PO TABS
1.0000 | ORAL_TABLET | Freq: Once | ORAL | Status: AC
  Administered 2020-08-02: 1 via ORAL

## 2020-08-02 MED ORDER — TETANUS-DIPHTH-ACELL PERTUSSIS 5-2.5-18.5 LF-MCG/0.5 IM SUSY: 0.5000 mL | PREFILLED_SYRINGE | Freq: Once | INTRAMUSCULAR | Status: DC

## 2020-08-02 MED ORDER — TETANUS-DIPHTH-ACELL PERTUSSIS 5-2.5-18.5 LF-MCG/0.5 IM SUSY
0.5000 mL | PREFILLED_SYRINGE | Freq: Once | INTRAMUSCULAR | Status: AC
  Administered 2020-08-02: 0.5 mL via INTRAMUSCULAR
  Filled 2020-08-02 (×2): qty 0.5

## 2020-08-02 NOTE — ED Provider Notes (Signed)
[facility] Emergency Department Provider Note ____________________________________________  Time seen: 2027  I have reviewed the triage vital signs and the nursing notes.  HISTORY  Chief Complaint  Laceration  HPI Adam Newman is a 55 y.o. male presents to the  company by his wife, for an accidental laceration to the left knee.  Patient was loading his boat after a fishing expedition, when he accidentally slipped on a knee wall.  He apparently landed on a broken beer bottle, causing a laceration to the inferior patella.  He denies any other injury at this time.  Patient is unclear of his current tetanus status. He denies any disability or significant dysfunction at the knee joint.  Past Medical History:  Diagnosis Date  . Asthma     There are no problems to display for this patient.   History reviewed. No pertinent surgical history.  Prior to Admission medications   Medication Sig Start Date End Date Taking? Authorizing Provider  sulfamethoxazole-trimethoprim (BACTRIM DS) 800-160 MG tablet Take 1 tablet by mouth 2 (two) times daily. 08/02/20  Yes Martyna Thorns, Charlesetta Ivory, PA-C  albuterol (PROVENTIL HFA;VENTOLIN HFA) 108 (90 Base) MCG/ACT inhaler Inhale 2 puffs into the lungs every 6 (six) hours as needed for wheezing or shortness of breath. 12/14/15   Jene Every, MD  albuterol (PROVENTIL) (2.5 MG/3ML) 0.083% nebulizer solution Take 3 mLs (2.5 mg total) by nebulization every 6 (six) hours as needed for wheezing or shortness of breath. 12/14/15   Jene Every, MD  fexofenadine (ALLEGRA) 180 MG tablet Take 1 tablet by mouth daily.    [provider]  fluticasone (FLONASE) 50 MCG/ACT nasal spray Place 2 sprays into the nose daily.    [provider]  Fluticasone-Salmeterol (ADVAIR) 100-50 MCG/DOSE AEPB Inhale 1 puff into the lungs 2 (two) times daily.    [provider]  predniSONE (DELTASONE) 50 MG tablet Take 1 tablet (50 mg total)  by mouth daily with breakfast. 12/14/15   Jene Every, MD    Allergies Penicillins  History reviewed. No pertinent family history.  Social History Social History   Tobacco Use  . Smoking status: Current Some Day Smoker  . Smokeless tobacco: Current User    Types: Snuff  Substance Use Topics  . Alcohol use: Not Currently    Review of Systems  Constitutional: Negative for fever. Cardiovascular: Negative for chest pain. Respiratory: Negative for shortness of breath. Gastrointestinal: Negative for abdominal pain, vomiting and diarrhea. Genitourinary: Negative for dysuria. Musculoskeletal: Negative for back pain.  Left knee laceration as above. Skin: Negative for rash. Neurological: Negative for headaches, focal weakness or numbness. ____________________________________________  PHYSICAL EXAM:  VITAL SIGNS: ED Triage Vitals  Enc Vitals Group     BP 08/02/20 1930 (!) 159/108     Pulse Rate 08/02/20 1930 (!) 102     Resp 08/02/20 1930 16     Temp 08/02/20 1930 98.1 F (36.7 C)     Temp Source 08/02/20 1930 Oral     SpO2 08/02/20 1930 97 %     Weight 08/02/20 1928 150 lb (68 kg)     Height 08/02/20 1928 5\' 8"  (1.727 m)     Head Circumference --      Peak Flow --      Pain Score 08/02/20 1928 0     Pain Loc --      Pain Edu? --      Excl. in GC? --     Constitutional: Alert and oriented. Well  appearing and in no distress. Head: Normocephalic and atraumatic. Eyes: Conjunctivae are normal. Normal extraocular movements Cardiovascular: Normal rate, regular rhythm. Normal distal pulses. Respiratory: Normal respiratory effort. No wheezes/rales/rhonchi. Gastrointestinal: Soft and nontender. No distention. Musculoskeletal: Left knee with linear laceration across the inferior patella. The wound measures 5 cm. Active bleeding a early clot noted. Normal ROM noted.  Nontender with normal range of motion in all extremities.  Neurologic:  Normal gait without ataxia. Normal  speech and language. No gross focal neurologic deficits are appreciated. Skin:  Skin is warm, dry and intact. No rash noted. ___________________________________________   RADIOLOGY  DG Right Knee  IMPRESSION: 1. Soft tissue laceration at the infrapatellar region of the anterior left knee. Superimposed 3 mm linear density suspicious for a small radiopaque foreign body. 2. No acute fracture or dislocation.  I, Lissa Hoard, personally viewed and evaluated these images (plain radiographs) as part of my medical decision making, as well as reviewing the written report by the radiologist. ____________________________________________  PROCEDURES  Tdap 0.5 ml IM Bactrim DS 1 PO  .Marland KitchenLaceration Repair  Date/Time: 08/02/2020 8:37 PM Performed by: Lissa Hoard, PA-C Authorized by: Lissa Hoard, PA-C   Consent:    Consent obtained:  Verbal   Consent given by:  Patient   Risks, benefits, and alternatives were discussed: yes     Risks discussed:  Poor wound healing, pain and infection Universal protocol:    Procedure explained and questions answered to patient or proxy's satisfaction: yes     Test results available: yes     Imaging studies available: yes     Site/side marked: yes     Patient identity confirmed:  Verbally with patient Anesthesia:    Anesthesia method:  Local infiltration   Local anesthetic:  Lidocaine 1% w/o epi Laceration details:    Location:  Leg   Leg location:  L knee Exploration:    Hemostasis achieved with:  Direct pressure   Imaging obtained: x-ray     Imaging outcome: foreign body noted     Wound exploration: wound explored through full range of motion     Contaminated: no   Treatment:    Area cleansed with:  Saline and povidone-iodine   Amount of cleaning:  Extensive   Irrigation solution:  Sterile saline   Irrigation method:  Syringe   Debridement:  None   Undermining:  None   Scar revision: no     Layers/structures  repaired:  Deep subcutaneous Deep subcutaneous:    Suture size:  5-0 and 4-0   Suture material:  Vicryl   Suture technique:  Running   Number of sutures:  1 Skin repair:    Repair method:  Sutures   Suture size:  3-0   Suture material:  Nylon   Suture technique:  Simple interrupted   Number of sutures:  7 Approximation:    Approximation:  Close Repair type:    Repair type:  Simple Post-procedure details:    Dressing:  Non-adherent dressing and bulky dressing   Procedure completion:  Tolerated well, no immediate complications   ____________________________________________   INITIAL IMPRESSION / ASSESSMENT AND PLAN / ED COURSE  As part of my medical decision making, I reviewed the following data within the electronic MEDICAL RECORD NUMBER Radiograph reviewed soft tissue defect and Notes from prior ED visits   Patient presents to the ED for evaluation and management of an acute Laceration to the left knee.  Was evaluated for his complaint  with x-ray that revealed a small radiolucent defect consistent with a likely small piece of glass.  And soft tissue defect was appreciated.  The wound was prepped and draped as per usual.  Copious amounts of saline and Betadine were used to flush the wound.  The wound was closed in 2 layers with good wound edge approximation.  Patient was discharged with a bulky dressing and dressing supplies.  He was prophylaxed with a tetanus booster as well as Bactrim.  He will follow with primary provider for suture removal in 12 to 14 days.  Return precautions have been discussed.   Elwin Goodwill was evaluated in Emergency Department on 08/02/2020 for the symptoms described in the history of present illness. He was evaluated in the context of the global COVID-19 pandemic, which necessitated consideration that the patient might be at risk for infection with the SARS-CoV-2 virus that causes COVID-19. Institutional protocols and algorithms that pertain to the evaluation of  patients at risk for COVID-19 are in a state of rapid change based on information released by regulatory bodies including the CDC and federal and state organizations. These policies and algorithms were followed during the patient's care in the ED. ____________________________________________  FINAL CLINICAL IMPRESSION(S) / ED DIAGNOSES  Final diagnoses:  Knee laceration, right, initial encounter      Lissa Hoard, PA-C 08/02/20 2212    Chesley Noon, MD 08/02/20 [phone]

## 2020-08-02 NOTE — Discharge Instructions (Addendum)
Keep the wound clean, dry, and covered. Take the antibiotic as directed. Follow-up with Dr. Graciela Husbands for suture removal in 12-14 days.

## 2020-08-02 NOTE — ED Triage Notes (Signed)
L knee laceration by glass bottle while loading boat. Bleeding controlled by dressing. Ambulatory. Last tetanus 2-49yr ago

## 2021-03-04 ENCOUNTER — Inpatient Hospital Stay: Payer: 59 | Admitting: Oncology

## 2021-03-04 ENCOUNTER — Inpatient Hospital Stay: Payer: 59

## 2021-03-05 ENCOUNTER — Other Ambulatory Visit: Payer: Self-pay | Admitting: Neurosurgery

## 2021-03-05 DIAGNOSIS — M48062 Spinal stenosis, lumbar region with neurogenic claudication: Secondary | ICD-10-CM

## 2021-03-11 ENCOUNTER — Other Ambulatory Visit: Payer: Self-pay

## 2021-03-11 ENCOUNTER — Inpatient Hospital Stay: Payer: 59

## 2021-03-11 ENCOUNTER — Inpatient Hospital Stay: Payer: 59 | Attending: Oncology | Admitting: Oncology

## 2021-03-11 ENCOUNTER — Encounter: Payer: Self-pay | Admitting: Oncology

## 2021-03-11 VITALS — BP 167/97 | HR 16 | Temp 97.8°F | Resp 16 | Wt 158.0 lb

## 2021-03-11 DIAGNOSIS — F17210 Nicotine dependence, cigarettes, uncomplicated: Secondary | ICD-10-CM | POA: Diagnosis not present

## 2021-03-11 DIAGNOSIS — D72829 Elevated white blood cell count, unspecified: Secondary | ICD-10-CM

## 2021-03-11 DIAGNOSIS — Z720 Tobacco use: Secondary | ICD-10-CM

## 2021-03-11 LAB — HEPATITIS PANEL, ACUTE
HCV Ab: NONREACTIVE
Hep A IgM: NONREACTIVE
Hep B C IgM: NONREACTIVE
Hepatitis B Surface Ag: NONREACTIVE

## 2021-03-11 LAB — CBC WITH DIFFERENTIAL/PLATELET
Abs Immature Granulocytes: 0.06 10*3/uL (ref 0.00–0.07)
Basophils Absolute: 0.1 10*3/uL (ref 0.0–0.1)
Basophils Relative: 1 %
Eosinophils Absolute: 0.7 10*3/uL — ABNORMAL HIGH (ref 0.0–0.5)
Eosinophils Relative: 6 %
HCT: 39.3 % (ref 39.0–52.0)
Hemoglobin: 13.5 g/dL (ref 13.0–17.0)
Immature Granulocytes: 1 %
Lymphocytes Relative: 31 %
Lymphs Abs: 3.6 10*3/uL (ref 0.7–4.0)
MCH: 31.3 pg (ref 26.0–34.0)
MCHC: 34.4 g/dL (ref 30.0–36.0)
MCV: 91 fL (ref 80.0–100.0)
Monocytes Absolute: 0.9 10*3/uL (ref 0.1–1.0)
Monocytes Relative: 7 %
Neutro Abs: 6.3 10*3/uL (ref 1.7–7.7)
Neutrophils Relative %: 54 %
Platelets: 424 10*3/uL — ABNORMAL HIGH (ref 150–400)
RBC: 4.32 MIL/uL (ref 4.22–5.81)
RDW: 12.6 % (ref 11.5–15.5)
WBC: 11.6 10*3/uL — ABNORMAL HIGH (ref 4.0–10.5)
nRBC: 0 % (ref 0.0–0.2)

## 2021-03-11 LAB — COMPREHENSIVE METABOLIC PANEL
ALT: 18 U/L (ref 0–44)
AST: 17 U/L (ref 15–41)
Albumin: 4.1 g/dL (ref 3.5–5.0)
Alkaline Phosphatase: 58 U/L (ref 38–126)
Anion gap: 11 (ref 5–15)
BUN: 13 mg/dL (ref 6–20)
CO2: 25 mmol/L (ref 22–32)
Calcium: 8.8 mg/dL — ABNORMAL LOW (ref 8.9–10.3)
Chloride: 101 mmol/L (ref 98–111)
Creatinine, Ser: 0.87 mg/dL (ref 0.61–1.24)
GFR, Estimated: >60 mL/min (ref >60)
Glucose, Bld: 102 mg/dL — ABNORMAL HIGH (ref 70–99)
Potassium: 3.6 mmol/L (ref 3.5–5.1)
Sodium: 137 mmol/L (ref 135–145)
Total Bilirubin: 0.2 mg/dL — ABNORMAL LOW (ref 0.3–1.2)
Total Protein: 7.2 g/dL (ref 6.5–8.1)

## 2021-03-11 LAB — LACTATE DEHYDROGENASE: LDH: 150 U/L (ref 98–192)

## 2021-03-11 LAB — TECHNOLOGIST SMEAR REVIEW: Plt Morphology: NORMAL

## 2021-03-11 LAB — HIV ANTIBODY (ROUTINE TESTING W REFLEX): HIV Screen 4th Generation wRfx: NONREACTIVE

## 2021-03-11 NOTE — Progress Notes (Signed)
Pt c/o chronic back pain. Bp elevated at this time 167/97

## 2021-03-11 NOTE — Progress Notes (Signed)
Hematology/Oncology Consult note [phone] [phone]   Patient Care Team: [name], MD as PCP - General (Internal Medicine)  REFERRING PROVIDER: [name], MD  CHIEF COMPLAINTS/REASON FOR VISIT:  Evaluation of leukocytosis  HISTORY OF PRESENTING ILLNESS:  Adam Newman is a  55 y.o.  male with PMH listed below who was referred to me for evaluation of leukocytosis Reviewed patient' recent labs obtained by PCP.   02/11/2021 CBC showed elevated white count of 12.8 Previous lab records reviewed. Leukocytosis onset of chronic, duration is since 2016  No aggravating or elevated factors. Denies weight loss, fever, chills,night sweats.  Fatigue Smoking history: some day smoker, 5-6 cigarettes daily  History of recent oral steroid use or steroid injection:  Denies recent infection:  He has asthma and he takes prednisone sometimes [1-2 times per year] for asthma/bronchitis flare.     Review of Systems  Constitutional:  Positive for fatigue. Negative for appetite change, chills, fever and unexpected weight change.  HENT:   Negative for hearing loss and voice change.   Eyes:  Negative for eye problems and icterus.  Respiratory:  Negative for chest tightness, cough and shortness of breath.   Cardiovascular:  Negative for chest pain and leg swelling.  Gastrointestinal:  Negative for abdominal distention and abdominal pain.  Endocrine: Negative for hot flashes.  Genitourinary:  Negative for difficulty urinating, dysuria and frequency.   Musculoskeletal:  Positive for back pain. Negative for arthralgias.  Skin:  Negative for itching and rash.  Neurological:  Negative for light-headedness and numbness.  Hematological:  Negative for adenopathy. Does not bruise/bleed easily.  Psychiatric/Behavioral:  Negative for confusion.     MEDICAL HISTORY:  Past Medical History:  Diagnosis Date   Asthma     SURGICAL HISTORY: Past Surgical History:   Procedure Laterality Date   NASAL SEPTUM SURGERY Bilateral     SOCIAL HISTORY: Social History   Socioeconomic History   Marital status: Married    Spouse name: Not on file   Number of children: Not on file   Years of education: Not on file   Highest education level: Not on file  Occupational History   Not on file  Tobacco Use   Smoking status: Some Days    Packs/day: 0.25    Types: Cigarettes   Smokeless tobacco: Current    Types: Snuff  Vaping Use   Vaping Use: Never used  Substance and Sexual Activity   Alcohol use: Not Currently   Drug use: Never   Sexual activity: Yes  Other Topics Concern   Not on file  Social History Narrative   Not on file   Social Determinants of Health   Financial Resource Strain: Not on file  Food Insecurity: Not on file  Transportation Needs: Not on file  Physical Activity: Not on file  Stress: Not on file  Social Connections: Not on file  Intimate Partner Violence: Not on file    FAMILY HISTORY: Family History  Problem Relation Age of Onset   Diabetes Mother    Diabetes Father    Hemochromatosis Father     ALLERGIES:  is allergic to penicillins.  MEDICATIONS:  Current Outpatient Medications  Medication Sig Dispense Refill   albuterol (PROVENTIL HFA;VENTOLIN HFA) 108 (90 Base) MCG/ACT inhaler Inhale 2 puffs into the lungs every 6 (six) hours as needed for wheezing or shortness of breath. 1 Inhaler 2   albuterol (PROVENTIL) (2.5 MG/3ML) 0.083% nebulizer solution Take 3 mLs (2.5 mg total)  by nebulization every 6 (six) hours as needed for wheezing or shortness of breath. 75 mL 1   celecoxib (CELEBREX) 200 MG capsule      fexofenadine (ALLEGRA) 180 MG tablet Take 1 tablet by mouth daily.     fluticasone (FLONASE) 50 MCG/ACT nasal spray Place 2 sprays into the nose daily.     Fluticasone-Salmeterol (ADVAIR) 100-50 MCG/DOSE AEPB Inhale 1 puff into the lungs 2 (two) times daily.     predniSONE (DELTASONE) 50 MG tablet Take 1 tablet  (50 mg total) by mouth daily with breakfast. (Patient not taking: Reported on 03/11/2021) 5 tablet 0   sulfamethoxazole-trimethoprim (BACTRIM DS) 800-160 MG tablet Take 1 tablet by mouth 2 (two) times daily. (Patient not taking: Reported on 03/11/2021) 20 tablet 0   No current facility-administered medications for this visit.     PHYSICAL EXAMINATION: ECOG PERFORMANCE STATUS: 0 - Asymptomatic Vitals:   03/11/21 1457  BP: (!) 167/97  Pulse: (!) 16  Resp: 16  Temp: 97.8 F (36.6 C)  SpO2: 98%   Filed Weights   03/11/21 1457  Weight: 158 lb (71.7 kg)    Physical Exam Constitutional:      General: He is not in acute distress. HENT:     Head: Normocephalic and atraumatic.  Eyes:     General: No scleral icterus. Cardiovascular:     Rate and Rhythm: Normal rate and regular rhythm.     Heart sounds: Normal heart sounds.  Pulmonary:     Effort: Pulmonary effort is normal. No respiratory distress.     Breath sounds: No wheezing.  Abdominal:     General: Bowel sounds are normal. There is no distension.     Palpations: Abdomen is soft.  Musculoskeletal:        General: No deformity. Normal range of motion.     Cervical back: Normal range of motion and neck supple.  Skin:    General: Skin is warm and dry.     Findings: No erythema or rash.  Neurological:     Mental Status: He is alert and oriented to person, place, and time. Mental status is at baseline.     Cranial Nerves: No cranial nerve deficit.     Coordination: Coordination normal.  Psychiatric:        Mood and Affect: Mood normal.    CMP Latest Ref Rng & Units 03/11/2021  Glucose 70 - 99 mg/dL 102(H)  BUN 6 - 20 mg/dL 13  Creatinine 0.61 - 1.24 mg/dL 0.87  Sodium 135 - 145 mmol/L 137  Potassium 3.5 - 5.1 mmol/L 3.6  Chloride 98 - 111 mmol/L 101  CO2 22 - 32 mmol/L 25  Calcium 8.9 - 10.3 mg/dL 8.8(L)  Total Protein 6.5 - 8.1 g/dL 7.2  Total Bilirubin 0.3 - 1.2 mg/dL 0.2(L)  Alkaline Phos 38 - 126 U/L 58  AST 15  - 41 U/L 17  ALT 0 - 44 U/L 18   CBC Latest Ref Rng & Units 03/11/2021  WBC 4.0 - 10.5 K/uL 11.6(H)  Hemoglobin 13.0 - 17.0 g/dL 13.5  Hematocrit 39.0 - 52.0 % 39.3  Platelets 150 - 400 K/uL 424(H)     RADIOGRAPHIC STUDIES: I have personally reviewed the radiological images as listed and agreed with the findings in the report. No results found.  LABORATORY DATA:  I have reviewed the data as listed Lab Results  Component Value Date   WBC 11.6 (H) 03/11/2021   HGB 13.5 03/11/2021   HCT 39.3 03/11/2021  MCV 91.0 03/11/2021   PLT 424 (H) 03/11/2021   Recent Labs    03/11/21 1533  NA 137  K 3.6  CL 101  CO2 25  GLUCOSE 102*  BUN 13  CREATININE 0.87  CALCIUM 8.8*  GFRNONAA >60  PROT 7.2  ALBUMIN 4.1  AST 17  ALT 18  ALKPHOS 58  BILITOT 0.2*   Iron/TIBC/Ferritin/ %Sat No results found for: IRON, TIBC, FERRITIN, IRONPCTSAT      ASSESSMENT & PLAN:  1. Leukocytosis, unspecified type   2. Tobacco use    Labs reviewed and discussed with patient that Leukocytosis, predominantly neutrophilia, can be secondary to infection, chronic inflammation, smoking, autoimmune disease, or underlying bone marrow disorders.   Probabaly secondary to smoking, rule out other etiologies.  For the work up of patient's leukocytosis, I recommend checking CBC;CMP, LDH, smear review, flowcytometry, myeloma panel, ANA, hepatitis panel HIV etc. He can follow up in 3-4 weeks to review results.   Tobacco use, smoke cessation was discussed with patient.     Orders Placed This Encounter  Procedures   Technologist smear review    Standing Status:   Future    Number of Occurrences:   1    Standing Expiration Date:   03/11/2022   Comprehensive metabolic panel    Standing Status:   Future    Number of Occurrences:   1    Standing Expiration Date:   03/11/2022   CBC with Differential/Platelet    Standing Status:   Future    Number of Occurrences:   1    Standing Expiration Date:   03/11/2022    Flow cytometry panel-leukemia/lymphoma work-up    Standing Status:   Future    Number of Occurrences:   1    Standing Expiration Date:   03/11/2022   Kappa/lambda light chains    Standing Status:   Future    Number of Occurrences:   1    Standing Expiration Date:   03/11/2022   Multiple Myeloma Panel (SPEP&IFE w/QIG)    Standing Status:   Future    Number of Occurrences:   1    Standing Expiration Date:   03/11/2022   Lactate dehydrogenase    Standing Status:   Future    Number of Occurrences:   1    Standing Expiration Date:   03/11/2022   ANA, IFA (with reflex)    Standing Status:   Future    Number of Occurrences:   1    Standing Expiration Date:   03/11/2022   Hepatitis panel, acute    Standing Status:   Future    Number of Occurrences:   1    Standing Expiration Date:   03/11/2022   HIV Antibody (routine testing w rflx)    Standing Status:   Future    Number of Occurrences:   1    Standing Expiration Date:   03/11/2022    All questions were answered. The patient knows to call the clinic with any problems questions or concerns.  Thank you for this kind referral and the opportunity to participate in the care of this patient. A copy of today's note is routed to referring provider   Earlie Server, MD, PhD  03/11/2021

## 2021-03-12 LAB — KAPPA/LAMBDA LIGHT CHAINS
Kappa free light chain: 22.4 mg/L — ABNORMAL HIGH (ref 3.3–19.4)
Kappa, lambda light chain ratio: 1.12 (ref 0.26–1.65)
Lambda free light chains: 20 mg/L (ref 5.7–26.3)

## 2021-03-12 LAB — ANTINUCLEAR ANTIBODIES, IFA: ANA Ab, IFA: NEGATIVE

## 2021-03-16 LAB — MULTIPLE MYELOMA PANEL, SERUM
Albumin SerPl Elph-Mcnc: 4 g/dL (ref 2.9–4.4)
Albumin/Glob SerPl: 1.4 (ref 0.7–1.7)
Alpha 1: 0.2 g/dL (ref 0.0–0.4)
Alpha2 Glob SerPl Elph-Mcnc: 0.7 g/dL (ref 0.4–1.0)
B-Globulin SerPl Elph-Mcnc: 1.1 g/dL (ref 0.7–1.3)
Gamma Glob SerPl Elph-Mcnc: 0.8 g/dL (ref 0.4–1.8)
Globulin, Total: 2.9 g/dL (ref 2.2–3.9)
IgA: 348 mg/dL (ref 90–386)
IgG (Immunoglobin G), Serum: 897 mg/dL (ref 603–1613)
IgM (Immunoglobulin M), Srm: 56 mg/dL (ref 20–172)
Total Protein ELP: 6.9 g/dL (ref 6.0–8.5)

## 2021-03-16 LAB — COMP PANEL: LEUKEMIA/LYMPHOMA

## 2021-03-19 ENCOUNTER — Ambulatory Visit
Admission: RE | Admit: 2021-03-19 | Discharge: 2021-03-19 | Disposition: A | Discharge status: Home / Self Care | Payer: 59 | Source: Ambulatory Visit | Attending: Neurosurgery | Admitting: Neurosurgery

## 2021-03-19 DIAGNOSIS — M48062 Spinal stenosis, lumbar region with neurogenic claudication: Secondary | ICD-10-CM | POA: Insufficient documentation

## 2021-03-19 IMAGING — MR MR LUMBAR SPINE W/O CM
5 series · 31 of 48 positions shown · non-contrast
Comparison: [DATE]

CLINICAL DATA: Low back pain with bilateral leg pain for 3-4 years,
worsening

EXAM:
MRI LUMBAR SPINE WITHOUT CONTRAST
TECHNIQUE: Multiplanar, multisequence MR imaging of the lumbar spine was
performed. No intravenous contrast was administered.

[Series 5: T2 · sagittal · 4.0mm · 0.81mm/px · 6 of 17 slices shown (1 of 2)]
[im 1/17]
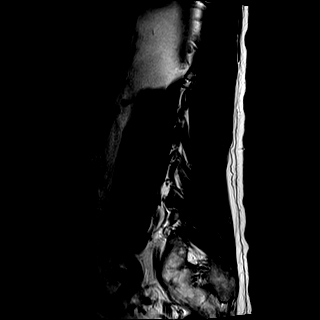
[im 4/17]
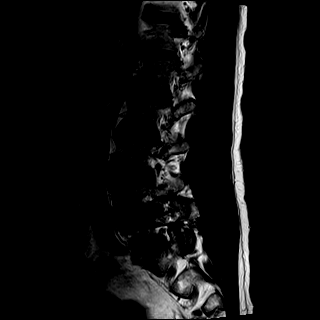
[im 7/17]
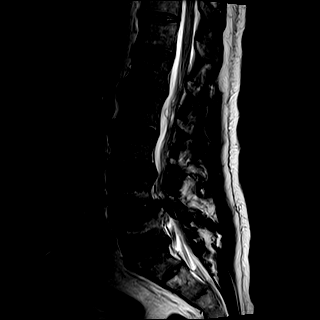
[im 10/17]
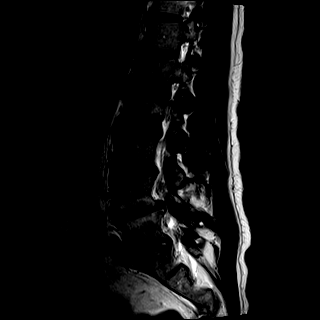
[im 13/17]
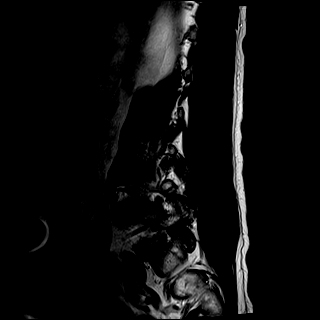
[im 17/17]
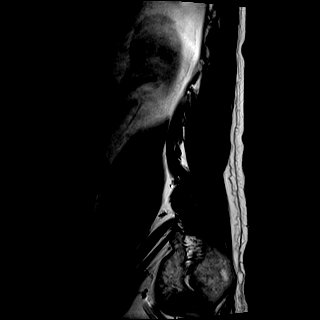

[Series 6: T1 · sagittal · 4.0mm · 0.81mm/px · 7 of 17 slices shown (1 of 2)]
[im 1/17]
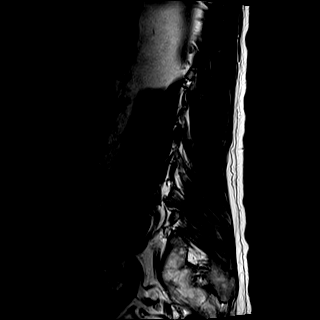
[im 3/17]
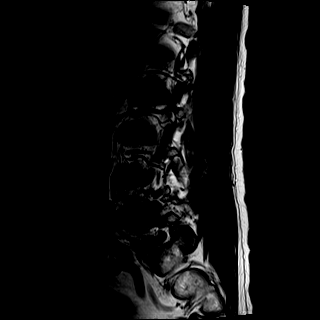
[im 6/17]
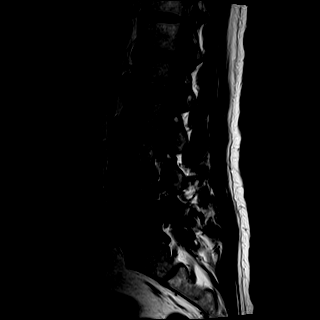
[im 9/17]
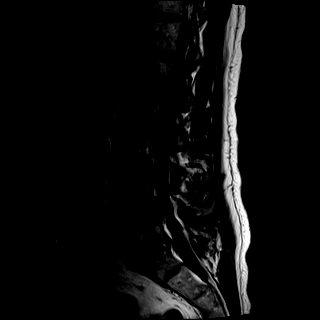
[im 11/17]
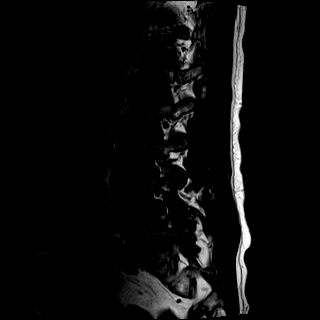
[im 14/17]
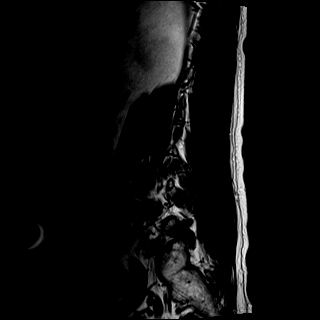
[im 17/17]
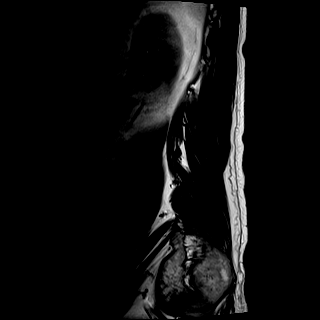

[Series 7: STIR · sagittal · 4.0mm · 0.41mm/px · 2 of 17 slices shown]
[im 1/17]
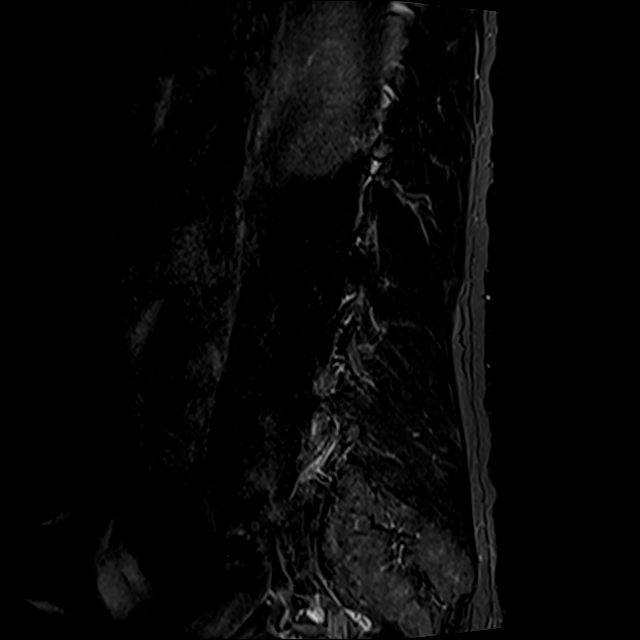
[im 3/17]
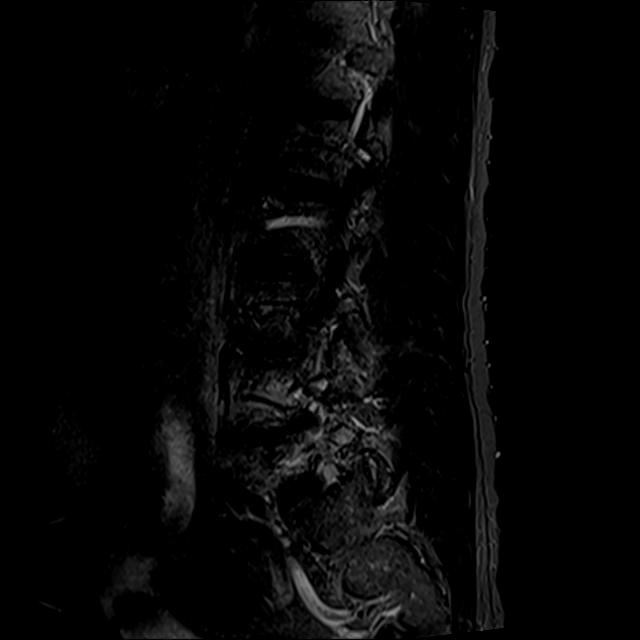

[Series 8: T2 · axial · 4.0mm · 0.78mm/px · z∈[-65,+143]mm · 8 of 37 slices shown (2 of 2)]
[im 1/37]
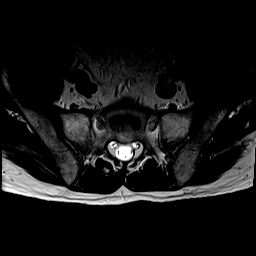
[im 6/37]
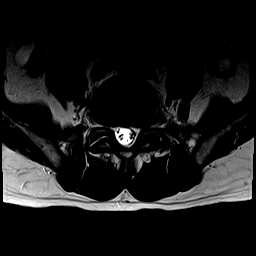
[im 12/37]
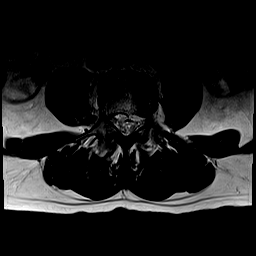
[im 17/37]
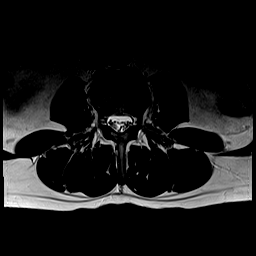
[im 20/37]
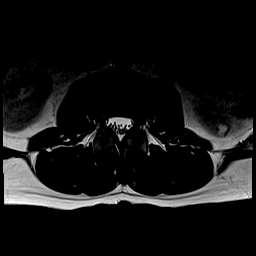
[im 25/37]
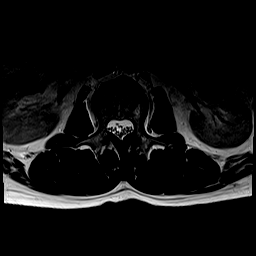
[im 31/37]
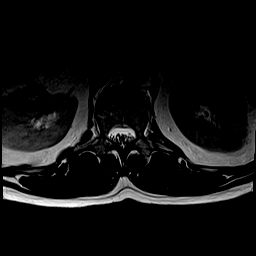
[im 37/37]
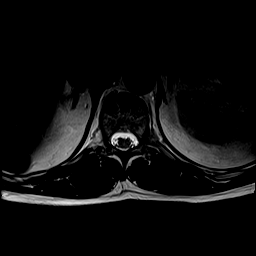

[Series 9: T1 · axial · 4.0mm · 0.39mm/px · z∈[-65,+143]mm · 8 of 37 slices shown (2 of 2)]
[im 1/37]
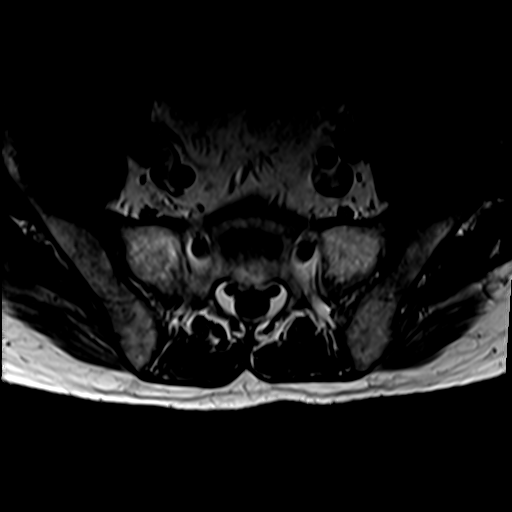
[im 6/37]
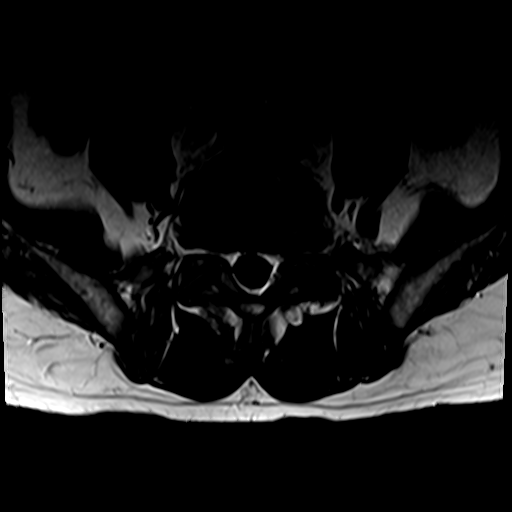
[im 12/37]
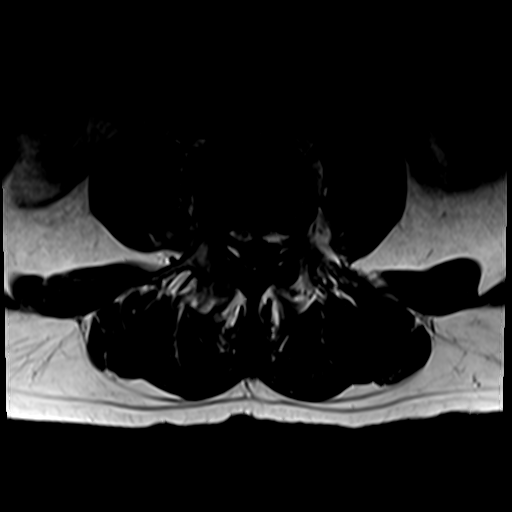
[im 17/37]
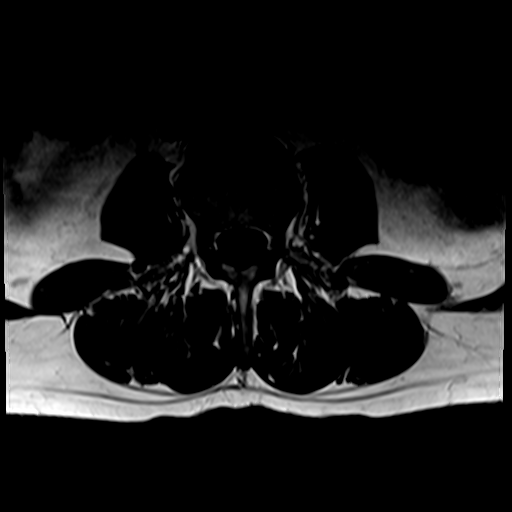
[im 20/37]
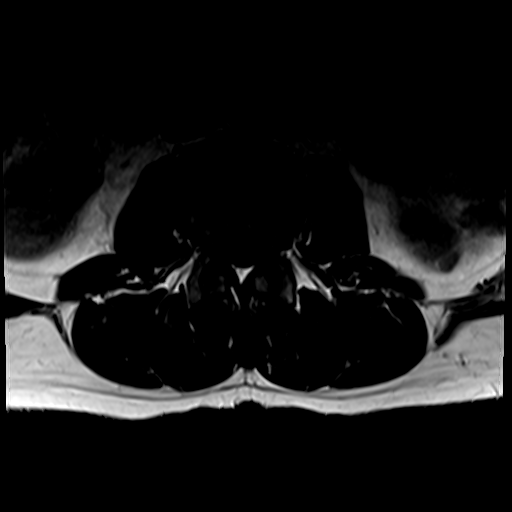
[im 25/37]
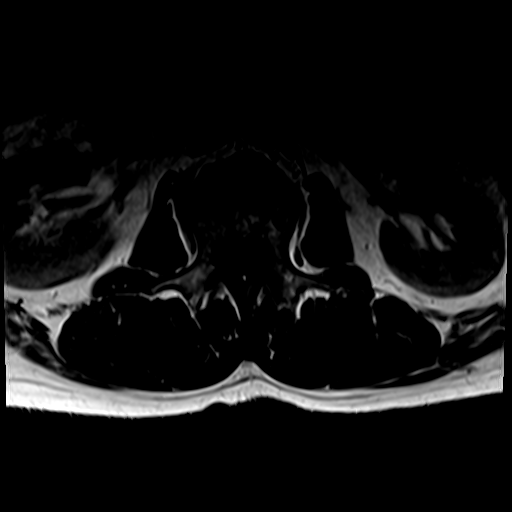
[im 31/37]
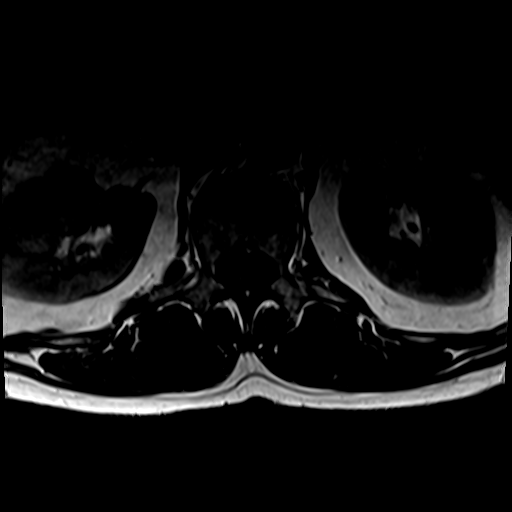
[im 37/37]
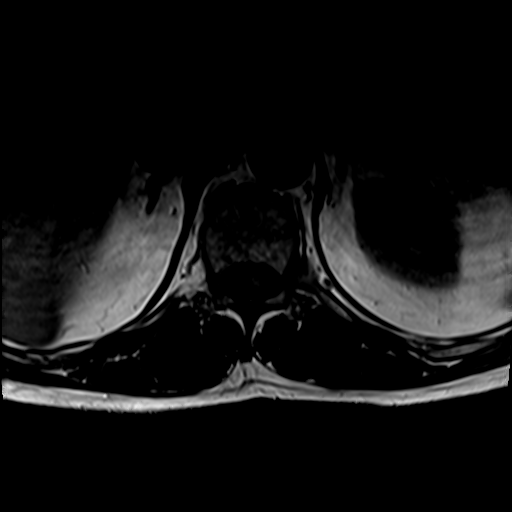

[31 of 48 positions shown; findings below may reference images not displayed]

FINDINGS: Segmentation:  5 lumbar type vertebrae

Alignment:  Grade 1 anterolisthesis at L4-5.

Vertebrae: Discogenic endplate edema preferentially to the right at
L4-5. Sparing of the left-sided disc and lack of disc T2
hyperintensity argues against infection. No acute fracture or
aggressive bone lesion

Conus medullaris and cauda equina: Conus extends to the L1-2 level.
Conus appears normal. Cauda equina redundancy due to the degree of
severe spinal stenosis.

Paraspinal and other soft tissues: Negative

Disc levels:

T12- L1: Unremarkable.

L1-L2: Mild disc space narrowing and bulging

L2-L3: Unremarkable.

L3-L4: Disc bulging with posterior annular fissuring.

L4-L5: Severe facet osteoarthritis with spurring, joint effusions,
anterolisthesis. Progressive disc collapse and endplate degeneration
eccentric to the right with discogenic marrow edema. Bilateral
foraminal impingement, particularly severe on the right. Advanced
spinal stenosis.

L5-S1:Disc narrowing and bulging with facet spurring greater on the
left where there is foraminal impingement.
IMPRESSION: 1. L4-5 severe facet osteoarthritis with anterolisthesis and
progressive disc degeneration associated with marrow edema. L4-5
severe spinal and right foraminal stenosis.
2. L4-5 and L5-S1 left foraminal impingement.

## 2021-04-07 ENCOUNTER — Inpatient Hospital Stay: Payer: 59 | Attending: Oncology | Admitting: Oncology

## 2021-04-07 ENCOUNTER — Encounter: Payer: Self-pay | Admitting: Oncology

## 2021-04-07 ENCOUNTER — Other Ambulatory Visit: Payer: Self-pay

## 2021-04-07 VITALS — BP 146/93 | HR 93 | Temp 97.2°F | Resp 18 | Wt 157.2 lb

## 2021-04-07 DIAGNOSIS — D75839 Thrombocytosis, unspecified: Secondary | ICD-10-CM | POA: Diagnosis not present

## 2021-04-07 DIAGNOSIS — Z720 Tobacco use: Secondary | ICD-10-CM | POA: Diagnosis not present

## 2021-04-07 DIAGNOSIS — F17210 Nicotine dependence, cigarettes, uncomplicated: Secondary | ICD-10-CM | POA: Insufficient documentation

## 2021-04-07 DIAGNOSIS — D72829 Elevated white blood cell count, unspecified: Secondary | ICD-10-CM | POA: Diagnosis not present

## 2021-04-07 DIAGNOSIS — D7219 Other eosinophilia: Secondary | ICD-10-CM | POA: Diagnosis not present

## 2021-04-07 NOTE — Progress Notes (Signed)
Patient here for follow up. No new concerns voiced.  °

## 2021-04-07 NOTE — Progress Notes (Signed)
Hematology/Oncology Consult note [phone] [phone]   Patient Care Team: Lynnea Ferrier, MD as PCP - General (Internal Medicine)  REFERRING PROVIDER: Lynnea Ferrier, MD  CHIEF COMPLAINTS/REASON FOR VISIT:  leukocytosis  HISTORY OF PRESENTING ILLNESS:  Adam Newman is a  56 y.o.  male with PMH listed below who was referred to me for evaluation of leukocytosis Reviewed patient' recent labs obtained by PCP.   02/11/2021 CBC showed elevated white count of 12.8 Previous lab records reviewed. Leukocytosis onset of chronic, duration is since 2016  No aggravating or elevated factors. Denies weight loss, fever, chills,night sweats.  Fatigue Smoking history: some day smoker, 5-6 cigarettes daily  History of recent oral steroid use or steroid injection:  Denies recent infection:  He has asthma and he takes prednisone sometimes [1-2 times per year] for asthma/bronchitis flare.   INTERVAL HISTORY Adam Newman is a 56 y.o. male who has above history reviewed by me today presents for follow up visit for leukocytosis.  He had blood work done since last visit. No new complaints.    Review of Systems  Constitutional:  Positive for fatigue. Negative for appetite change, chills, fever and unexpected weight change.  HENT:   Negative for hearing loss and voice change.   Eyes:  Negative for eye problems and icterus.  Respiratory:  Negative for chest tightness, cough and shortness of breath.   Cardiovascular:  Negative for chest pain and leg swelling.  Gastrointestinal:  Negative for abdominal distention and abdominal pain.  Endocrine: Negative for hot flashes.  Genitourinary:  Negative for difficulty urinating, dysuria and frequency.   Musculoskeletal:  Positive for back pain. Negative for arthralgias.  Skin:  Negative for itching and rash.  Neurological:  Negative for light-headedness and numbness.  Hematological:  Negative for adenopathy. Does not bruise/bleed  easily.  Psychiatric/Behavioral:  Negative for confusion.     MEDICAL HISTORY:  Past Medical History:  Diagnosis Date   Asthma     SURGICAL HISTORY: Past Surgical History:  Procedure Laterality Date   NASAL SEPTUM SURGERY Bilateral     SOCIAL HISTORY: Social History   Socioeconomic History   Marital status: Married    Spouse name: Not on file   Number of children: Not on file   Years of education: Not on file   Highest education level: Not on file  Occupational History   Not on file  Tobacco Use   Smoking status: Some Days    Packs/day: 0.25    Types: Cigarettes   Smokeless tobacco: Current    Types: Snuff  Vaping Use   Vaping Use: Never used  Substance and Sexual Activity   Alcohol use: Not Currently   Drug use: Never   Sexual activity: Yes  Other Topics Concern   Not on file  Social History Narrative   Not on file   Social Determinants of Health   Financial Resource Strain: Not on file  Food Insecurity: Not on file  Transportation Needs: Not on file  Physical Activity: Not on file  Stress: Not on file  Social Connections: Not on file  Intimate Partner Violence: Not on file    FAMILY HISTORY: Family History  Problem Relation Age of Onset   Diabetes Mother    Diabetes Father    Hemochromatosis Father     ALLERGIES:  is allergic to penicillins.  MEDICATIONS:  Current Outpatient Medications  Medication Sig Dispense Refill   albuterol (PROVENTIL HFA;VENTOLIN HFA) 108 (90 Base) MCG/ACT inhaler Inhale  2 puffs into the lungs every 6 (six) hours as needed for wheezing or shortness of breath. 1 Inhaler 2   albuterol (PROVENTIL) (2.5 MG/3ML) 0.083% nebulizer solution Take 3 mLs (2.5 mg total) by nebulization every 6 (six) hours as needed for wheezing or shortness of breath. 75 mL 1   celecoxib (CELEBREX) 200 MG capsule      fexofenadine (ALLEGRA) 180 MG tablet Take 1 tablet by mouth daily.     fluticasone (FLONASE) 50 MCG/ACT nasal spray Place 2 sprays  into the nose daily.     Fluticasone-Salmeterol (ADVAIR) 100-50 MCG/DOSE AEPB Inhale 1 puff into the lungs 2 (two) times daily.     montelukast (SINGULAIR) 10 MG tablet Take 10 mg by mouth daily.     No current facility-administered medications for this visit.     PHYSICAL EXAMINATION: ECOG PERFORMANCE STATUS: 0 - Asymptomatic Vitals:   04/07/21 1402  BP: (!) 146/93  Pulse: 93  Resp: 18  Temp: (!) 97.2 F (36.2 C)   Filed Weights   04/07/21 1402  Weight: 157 lb 3.2 oz (71.3 kg)    Physical Exam Constitutional:      General: He is not in acute distress. HENT:     Head: Normocephalic and atraumatic.  Eyes:     General: No scleral icterus. Cardiovascular:     Rate and Rhythm: Normal rate and regular rhythm.     Heart sounds: Normal heart sounds.  Pulmonary:     Effort: Pulmonary effort is normal. No respiratory distress.     Breath sounds: No wheezing.  Abdominal:     General: Bowel sounds are normal. There is no distension.     Palpations: Abdomen is soft.  Musculoskeletal:        General: No deformity. Normal range of motion.     Cervical back: Normal range of motion and neck supple.  Skin:    General: Skin is warm and dry.     Findings: No erythema or rash.  Neurological:     Mental Status: He is alert and oriented to person, place, and time. Mental status is at baseline.     Cranial Nerves: No cranial nerve deficit.     Coordination: Coordination normal.  Psychiatric:        Mood and Affect: Mood normal.        RADIOGRAPHIC STUDIES: I have personally reviewed the radiological images as listed and agreed with the findings in the report. MR LUMBAR SPINE WO CONTRAST  Result Date: 03/20/2021 CLINICAL DATA:  Low back pain with bilateral leg pain for 3-4 years, worsening EXAM: MRI LUMBAR SPINE WITHOUT CONTRAST TECHNIQUE: Multiplanar, multisequence MR imaging of the lumbar spine was performed. No intravenous contrast was administered. COMPARISON:  11/07/2019  FINDINGS: Segmentation:  5 lumbar type vertebrae Alignment:  Grade 1 anterolisthesis at L4-5. Vertebrae: Discogenic endplate edema preferentially to the right at L4-5. Sparing of the left-sided disc and lack of disc T2 hyperintensity argues against infection. No acute fracture or aggressive bone lesion Conus medullaris and cauda equina: Conus extends to the L1-2 level. Conus appears normal. Cauda equina redundancy due to the degree of severe spinal stenosis. Paraspinal and other soft tissues: Negative Disc levels: T12- L1: Unremarkable. L1-L2: Mild disc space narrowing and bulging L2-L3: Unremarkable. L3-L4: Disc bulging with posterior annular fissuring. L4-L5: Severe facet osteoarthritis with spurring, joint effusions, anterolisthesis. Progressive disc collapse and endplate degeneration eccentric to the right with discogenic marrow edema. Bilateral foraminal impingement, particularly severe on the right. Advanced spinal  stenosis. L5-S1:Disc narrowing and bulging with facet spurring greater on the left where there is foraminal impingement. IMPRESSION: 1. 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 L4-5 severe spinal and right foraminal stenosis. 2. L4-5 and L5-S1 left foraminal impingement. Electronically Signed   By: Tiburcio PeaJonathan  Watts M.D.   On: 03/20/2021 09:13    LABORATORY DATA:  I have reviewed the data as listed Lab Results  Component Value Date   WBC 11.6 (H) 03/11/2021   HGB 13.5 03/11/2021   HCT 39.3 03/11/2021   MCV 91.0 03/11/2021   PLT 424 (H) 03/11/2021   Recent Labs    03/11/21 1533  NA 137  K 3.6  CL 101  CO2 25  GLUCOSE 102*  BUN 13  CREATININE 0.87  CALCIUM 8.8*  GFRNONAA >60  PROT 7.2  ALBUMIN 4.1  AST 17  ALT 18  ALKPHOS 58  BILITOT 0.2*    Iron/TIBC/Ferritin/ %Sat No results found for: IRON, TIBC, FERRITIN, IRONPCTSAT      ASSESSMENT & PLAN:  1. Leukocytosis, unspecified type   2.  Tobacco use    Labs reviewed and discussed with patient Chronic leukocytosis predominantly neutrophilia, and also eosinophilia, likely reactive etiology.  Could be secondary to smoking.  Negative peripheral blood flow cytometry, myeloma panel, negative hepatitis panel, HIV, negative ANA. He has no constitutional symptoms.  I will hold off additional work-up for leukocytosis. Smoke cessation strongly recommended.  Discussed with patient. Patient also has very mild thrombocytosis which could be reactive.  I Recommend patient to follow-up in 4 months repeat blood work, if stable, I will hold off additional work-up for thrombocytosis.  If progressively getting worse, will initiate thrombocytosis work-up at that point.     Orders Placed This Encounter  Procedures   Technologist smear review    Standing Status:   Future    Standing Expiration Date:   04/07/2022   CBC with Differential/Platelet    Standing Status:   Future    Standing Expiration Date:   04/07/2022    All questions were answered. The patient knows to call the clinic with any problems questions or concerns.  Rickard PatienceZhou Kaysen Deal, MD, PhD  04/07/2021

## 2021-04-09 ENCOUNTER — Other Ambulatory Visit: Payer: Self-pay | Admitting: Neurosurgery

## 2021-04-16 ENCOUNTER — Other Ambulatory Visit: Payer: Self-pay | Admitting: Neurosurgery

## 2021-04-24 NOTE — Progress Notes (Signed)
Surgical Instructions    Your procedure is scheduled on Thursday, January 26th, 2023.   Report to [facility] Main Entrance "A" at 09:50 A.M., then check in with the Admitting office.  Call this number if you have problems the morning of surgery:  [phone]   If you have any questions prior to your surgery date call [phone]: Open Monday-Friday 8am-4pm    Remember:  Do not eat or drink after midnight the night before your surgery     Take these medicines the morning of surgery with A SIP OF WATER:  ADVAIR DISKUS celecoxib (CELEBREX) fexofenadine (ALLEGRA) fluticasone (FLONASE)  If needed:  acetaminophen (TYLENOL)  albuterol (PROVENTIL HFA;VENTOLIN HFA)   As of today, STOP taking any Aspirin (unless otherwise instructed by your surgeon) Aleve, Naproxen, Ibuprofen, Motrin, Advil, Goody's, BC's, all herbal medications, fish oil, and all vitamins.   After your COVID test   You are not required to quarantine however you are required to wear a well-fitting mask when you are out and around people not in your household.  If your mask becomes wet or soiled, replace with a new one.  Wash your hands often with soap and water for 20 seconds or clean your hands with an alcohol-based hand sanitizer that contains at least 60% alcohol.  Do not share personal items.  Notify your provider: if you are in close contact with someone who has COVID  or if you develop a fever of 100.4 or greater, sneezing, cough, sore throat, shortness of breath or body aches.    The day of surgery:          Do not wear jewelry  Do not wear lotions, powders, colognes, or deodorant. Men may shave face and neck. Do not bring valuables to the hospital.              [facility] is not responsible for any belongings or valuables.  Do NOT Smoke (Tobacco/Vaping)  24 hours prior to your procedure  If you use a CPAP at night, you may bring your mask for your overnight stay.   Contacts, glasses,  hearing aids, dentures or partials may not be worn into surgery, please bring cases for these belongings   For patients admitted to the hospital, discharge time will be determined by your treatment team.   Patients discharged the day of surgery will not be allowed to drive home, and someone needs to stay with them for 24 hours.  NO VISITORS WILL BE ALLOWED IN PRE-OP WHERE PATIENTS ARE PREPPED FOR SURGERY.  ONLY 1 SUPPORT PERSON MAY BE PRESENT IN THE WAITING ROOM WHILE YOU ARE IN SURGERY.  IF YOU ARE TO BE ADMITTED, ONCE YOU ARE IN YOUR ROOM YOU WILL BE ALLOWED TWO (2) VISITORS. 1 (ONE) VISITOR MAY STAY OVERNIGHT BUT MUST ARRIVE TO THE ROOM BY 8pm.  White children may have two parents present. Special consideration for safety and communication needs will be reviewed on a case by case basis.  Special instructions:    Oral Hygiene is also important to reduce your risk of infection.  Remember - BRUSH YOUR TEETH THE MORNING OF SURGERY WITH YOUR REGULAR TOOTHPASTE   [place]- Preparing For Surgery  Before surgery, you can play an important role. Because skin is not sterile, your skin needs to be as free of germs as possible. You can reduce the number of germs on your skin by washing with CHG (chlorahexidine gluconate) Soap before surgery.  CHG is an antiseptic cleaner which kills  germs and bonds with the skin to continue killing germs even after washing.     Please do not use if you have an allergy to CHG or antibacterial soaps. If your skin becomes reddened/irritated stop using the CHG.  Do not shave (including legs and underarms) for at least 48 hours prior to first CHG shower. It is OK to shave your face.  Please follow these instructions carefully.     Shower the NIGHT BEFORE SURGERY and the MORNING OF SURGERY with CHG Soap.   If you chose to wash your hair, wash your hair first as usual with your normal shampoo. After you shampoo, rinse your hair and body thoroughly to remove the shampoo.   Then [employer] and genitals (private parts) with your normal soap and rinse thoroughly to remove soap.  After that Use CHG Soap as you would any other liquid soap. You can apply CHG directly to the skin and wash gently with a scrungie or a clean washcloth.   Apply the CHG Soap to your body ONLY FROM THE NECK DOWN.  Do not use on open wounds or open sores. Avoid contact with your eyes, ears, mouth and genitals (private parts). Wash Face and genitals (private parts)  with your normal soap.   Wash thoroughly, paying special attention to the area where your surgery will be performed.  Thoroughly rinse your body with warm water from the neck down.  DO NOT shower/wash with your normal soap after using and rinsing off the CHG Soap.  Pat yourself dry with a CLEAN TOWEL.  Wear CLEAN PAJAMAS to bed the night before surgery  Place CLEAN SHEETS on your bed the night before your surgery  DO NOT SLEEP WITH PETS.   Day of Surgery:  Take a shower with CHG soap. Wear Clean/Comfortable clothing the morning of surgery Do not apply any deodorants/lotions.   Remember to brush your teeth WITH YOUR REGULAR TOOTHPASTE.   Please read over the following fact sheets that you were given.

## 2021-04-27 ENCOUNTER — Encounter (HOSPITAL_COMMUNITY)
Admission: RE | Admit: 2021-04-27 | Discharge: 2021-04-27 | Disposition: A | Discharge status: Home / Self Care | Payer: 59 | Source: Ambulatory Visit | Attending: Neurosurgery | Admitting: Neurosurgery

## 2021-04-27 ENCOUNTER — Other Ambulatory Visit: Payer: Self-pay

## 2021-04-27 ENCOUNTER — Encounter (HOSPITAL_COMMUNITY): Payer: Self-pay

## 2021-04-27 VITALS — BP 151/91 | HR 76 | Temp 98.1°F | Resp 18 | Ht 68.0 in | Wt 153.0 lb

## 2021-04-27 DIAGNOSIS — Z20822 Contact with and (suspected) exposure to covid-19: Secondary | ICD-10-CM | POA: Insufficient documentation

## 2021-04-27 DIAGNOSIS — D72829 Elevated white blood cell count, unspecified: Secondary | ICD-10-CM | POA: Insufficient documentation

## 2021-04-27 DIAGNOSIS — Z01818 Encounter for other preprocedural examination: Secondary | ICD-10-CM | POA: Insufficient documentation

## 2021-04-27 DIAGNOSIS — M4316 Spondylolisthesis, lumbar region: Secondary | ICD-10-CM | POA: Insufficient documentation

## 2021-04-27 DIAGNOSIS — J45909 Unspecified asthma, uncomplicated: Secondary | ICD-10-CM | POA: Diagnosis not present

## 2021-04-27 DIAGNOSIS — I10 Essential (primary) hypertension: Secondary | ICD-10-CM | POA: Insufficient documentation

## 2021-04-27 DIAGNOSIS — F17210 Nicotine dependence, cigarettes, uncomplicated: Secondary | ICD-10-CM | POA: Insufficient documentation

## 2021-04-27 HISTORY — DX: Essential (primary) hypertension: I10

## 2021-04-27 LAB — BASIC METABOLIC PANEL
Anion gap: 9 (ref 5–15)
BUN: 12 mg/dL (ref 6–20)
CO2: 29 mmol/L (ref 22–32)
Calcium: 9.6 mg/dL (ref 8.9–10.3)
Chloride: 102 mmol/L (ref 98–111)
Creatinine, Ser: 1.01 mg/dL (ref 0.61–1.24)
GFR, Estimated: >60 mL/min (ref >60)
Glucose, Bld: 114 mg/dL — ABNORMAL HIGH (ref 70–99)
Potassium: 4.8 mmol/L (ref 3.5–5.1)
Sodium: 140 mmol/L (ref 135–145)

## 2021-04-27 LAB — SARS CORONAVIRUS 2 (TAT 6-24 HRS): SARS Coronavirus 2: NEGATIVE

## 2021-04-27 LAB — CBC
HCT: 47.4 % (ref 39.0–52.0)
Hemoglobin: 15.4 g/dL (ref 13.0–17.0)
MCH: 30.8 pg (ref 26.0–34.0)
MCHC: 32.5 g/dL (ref 30.0–36.0)
MCV: 94.8 fL (ref 80.0–100.0)
Platelets: 430 10*3/uL — ABNORMAL HIGH (ref 150–400)
RBC: 5 MIL/uL (ref 4.22–5.81)
RDW: 12.5 % (ref 11.5–15.5)
WBC: 13 10*3/uL — ABNORMAL HIGH (ref 4.0–10.5)
nRBC: 0 % (ref 0.0–0.2)

## 2021-04-27 LAB — TYPE AND SCREEN
ABO/RH(D): A POS
Antibody Screen: NEGATIVE

## 2021-04-27 LAB — SURGICAL PCR SCREEN
MRSA, PCR: NEGATIVE
Staphylococcus aureus: POSITIVE — AB

## 2021-04-27 NOTE — Progress Notes (Signed)
PCP - Lynnea Ferrier, MD Cardiologist - denies  PPM/ICD - denies Device Orders - n/a Rep Notified - n/a  Chest x-ray - n/a EKG - 04/27/2021 Stress Test - 12/11/2014 - records requested ECHO - denies Cardiac Cath - denies  Sleep Study - denies CPAP - n/a  Fasting Blood Sugar - n/a  Blood Thinner Instructions: n/a  Aspirin Instructions: Patient was instructed: As of today, STOP taking any Aspirin (unless otherwise instructed by your surgeon) Aleve, Naproxen, Ibuprofen, Motrin, Advil, Goody's, BC's, all herbal medications, fish oil, and all vitamins.  ERAS Protcol - n/a   COVID TEST- done in PAT on 04/27/2021   Anesthesia review: yes - chest pain in the past; records requested  Patient denies shortness of breath, fever, cough and chest pain at PAT appointment   All instructions explained to the patient, with a verbal understanding of the material. Patient agrees to go over the instructions while at home for a better understanding. Patient also instructed to self quarantine after being tested for COVID-19. The opportunity to ask questions was provided.

## 2021-04-28 NOTE — Progress Notes (Signed)
Anesthesia Chart Review:  Case: [id number] Date/Time: 04/30/21 1135   Procedure: PLIF,IP,POSTERIOR INSTRUMENTATION, L4-L5 - 3C   Anesthesia type: General   Pre-op diagnosis: SPONDYLOLISTHESIS, LUMBAR REGION   Location: [facility] ROOM 20 / [facility] OR   Surgeons: Tressie Stalker, MD       DISCUSSION: Patient is a 56 year old male scheduled for the above procedure.  History includes smoking, asthma, HTN, nasal septal surgery, leukocytosis (likely reactive, followed by hematologist Dr. Cathie Hoops).   According to records in Care Everywhere, he was evaluated for atypical chest pain "(improves with exercise") on 12/11/14. A d-dimer and exercise stress test ordered. He was also referred to GI. Per nursing notes on 12/18/14, "Pt notifed stress test is normal."  Preoperative EKG showed NSR. Preoperative labs showed WBC 13K, but known mild chronic leukocytosis followed by hematology. LFTs unremarkable 03/11/21.   04/27/2021 presurgical COVID-19 test negative.  Anesthesia team to evaluate on the day of surgery.    VS: BP (!) 151/91    Pulse 76    Temp 36.7 C    Resp 18    Ht 5\' 8"  (1.727 m)    Wt 69.4 kg    SpO2 100%    BMI 23.26 kg/m    PROVIDERS: , MD is PCP [facility], see [facility] Care Everywhere) - Cheyenne wells, MD is hematologist. Last visit 04/07/21 for leukocytosis, chronic since 2016. Per note, "Chronic leukocytosis predominantly neutrophilia, and also eosinophilia, likely reactive etiology.  Could be secondary to smoking.   Negative peripheral blood flow cytometry, myeloma panel, negative hepatitis panel, HIV, negative ANA. He has no constitutional symptoms.  I will hold off additional work-up for leukocytosis. Smoke cessation strongly recommended.  Discussed with patient. Patient also has very mild thrombocytosis which could be reactive.  I Recommend patient to follow-up in 4 months repeat blood work, if stable, I will hold off additional work-up for thrombocytosis.  If progressively  getting worse, will initiate thrombocytosis work-up at that point."   LABS: Labs reviewed: Acceptable for surgery. See DISCUSSION.  (all labs ordered are listed, but only abnormal results are displayed)  Labs Reviewed  SURGICAL PCR SCREEN - Abnormal; Notable for the following components:      Result Value   Staphylococcus aureus POSITIVE (*)    All other components within normal limits  BASIC METABOLIC PANEL - Abnormal; Notable for the following components:   Glucose, Bld 114 (*)    All other components within normal limits  CBC - Abnormal; Notable for the following components:   WBC 13.0 (*)    Platelets 430 (*)    All other components within normal limits  SARS CORONAVIRUS 2 (TAT 6-24 HRS)  TYPE AND SCREEN     IMAGES: MRI L-spine 03/19/21: IMPRESSION: 1. 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 L4-5 severe spinal and right foraminal stenosis. 2. L4-5 and L5-S1 left foraminal impingement.    EKG: 04/27/21: NSR   CV: Stress test, exercise 12/18/14 [facility]):  Per [facility] Everywhere nursing notes on 9/114/16, "Pt notifed stress test is normal."  Nuclear stress test 10/25/06: IMPRESSION:  1.     LV function normal.  2.     Moderate exercise tolerance.  3.     Low probability for ischemia.    Past Medical History:  Diagnosis Date   Asthma    Hypertension     Past Surgical History:  Procedure Laterality Date   NASAL SEPTUM SURGERY Bilateral  MEDICATIONS:  acetaminophen (TYLENOL) 500 MG tablet   ADVAIR DISKUS 250-50 MCG/ACT AEPB   albuterol (PROVENTIL HFA;VENTOLIN HFA) 108 (90 Base) MCG/ACT inhaler   albuterol (PROVENTIL) (2.5 MG/3ML) 0.083% nebulizer solution   celecoxib (CELEBREX) 200 MG capsule   fexofenadine (ALLEGRA) 180 MG tablet   fluticasone (FLONASE) 50 MCG/ACT nasal spray   montelukast (SINGULAIR) 10 MG tablet   sildenafil (VIAGRA) 50 MG tablet   No current  facility-administered medications for this encounter.    Shonna Chock, PA-C Surgical Short Stay/Anesthesiology [facility] Phone [phone] [facility] Phone [phone] 04/28/2021 2:54 PM

## 2021-04-28 NOTE — Anesthesia Preprocedure Evaluation (Addendum)
Anesthesia Evaluation  Patient identified by MRN, date of birth, ID band Patient awake    Reviewed: Allergy & Precautions, NPO status , Patient's Chart, lab work & pertinent test results  History of Anesthesia Complications Negative for: history of anesthetic complications  Airway Mallampati: I  TM Distance: >3 FB Neck ROM: Full    Dental  (+) Dental Advisory Given   Pulmonary COPD,  COPD inhaler, Current Smoker and Patient abstained from smoking.,    breath sounds clear to auscultation       Cardiovascular hypertension, (-) angina Rhythm:Regular Rate:Normal     Neuro/Psych Back pain    GI/Hepatic negative GI ROS, Neg liver ROS,   Endo/Other  negative endocrine ROS  Renal/GU negative Renal ROS     Musculoskeletal   Abdominal   Peds  Hematology negative hematology ROS (+)   Anesthesia Other Findings   Reproductive/Obstetrics                           Anesthesia Physical Anesthesia Plan  ASA: 2  Anesthesia Plan: General   Post-op Pain Management: Tylenol PO (pre-op) and Minimal or no pain anticipated   Induction: Intravenous  PONV Risk Score and Plan: 1 and Ondansetron and Dexamethasone  Airway Management Planned: Oral ETT  Additional Equipment: None  Intra-op Plan:   Post-operative Plan: Extubation in OR  Informed Consent: I have reviewed the patients History and Physical, chart, labs and discussed the procedure including the risks, benefits and alternatives for the proposed anesthesia with the patient or authorized representative who has indicated his/her understanding and acceptance.     Dental advisory given  Plan Discussed with: CRNA and Surgeon  Anesthesia Plan Comments: (PAT note written 04/28/2021 by Shonna Chock, PA-C. )       Anesthesia Quick Evaluation

## 2021-04-30 ENCOUNTER — Other Ambulatory Visit: Payer: Self-pay

## 2021-04-30 ENCOUNTER — Encounter (HOSPITAL_COMMUNITY)
Admission: RE | Disposition: A | Discharge status: Home / Self Care | Payer: Self-pay | Source: Home / Self Care | Attending: Neurosurgery

## 2021-04-30 ENCOUNTER — Encounter (HOSPITAL_COMMUNITY): Payer: Self-pay | Admitting: Neurosurgery

## 2021-04-30 ENCOUNTER — Ambulatory Visit (HOSPITAL_COMMUNITY): Payer: 59

## 2021-04-30 ENCOUNTER — Ambulatory Visit (HOSPITAL_COMMUNITY)
Admission: RE | Admit: 2021-04-30 | Discharge: 2021-05-01 | Disposition: A | Discharge status: Home / Self Care | Payer: 59 | Attending: Neurosurgery | Admitting: Neurosurgery

## 2021-04-30 ENCOUNTER — Ambulatory Visit (HOSPITAL_COMMUNITY): Payer: 59 | Admitting: Vascular Surgery

## 2021-04-30 ENCOUNTER — Ambulatory Visit (HOSPITAL_COMMUNITY): Payer: 59 | Admitting: Anesthesiology

## 2021-04-30 DIAGNOSIS — M5116 Intervertebral disc disorders with radiculopathy, lumbar region: Secondary | ICD-10-CM | POA: Insufficient documentation

## 2021-04-30 DIAGNOSIS — Z7951 Long term (current) use of inhaled steroids: Secondary | ICD-10-CM | POA: Insufficient documentation

## 2021-04-30 DIAGNOSIS — M48062 Spinal stenosis, lumbar region with neurogenic claudication: Secondary | ICD-10-CM | POA: Diagnosis not present

## 2021-04-30 DIAGNOSIS — I10 Essential (primary) hypertension: Secondary | ICD-10-CM | POA: Insufficient documentation

## 2021-04-30 DIAGNOSIS — J449 Chronic obstructive pulmonary disease, unspecified: Secondary | ICD-10-CM | POA: Diagnosis not present

## 2021-04-30 DIAGNOSIS — F17210 Nicotine dependence, cigarettes, uncomplicated: Secondary | ICD-10-CM | POA: Insufficient documentation

## 2021-04-30 DIAGNOSIS — M4316 Spondylolisthesis, lumbar region: Secondary | ICD-10-CM | POA: Diagnosis not present

## 2021-04-30 DIAGNOSIS — Z419 Encounter for procedure for purposes other than remedying health state, unspecified: Secondary | ICD-10-CM

## 2021-04-30 LAB — ABO/RH: ABO/RH(D): A POS

## 2021-04-30 IMAGING — CR DG LUMBAR SPINE 1V
1 series · 1 of 1 positions shown · non-contrast
Comparison: Portable cross-table lateral view at [JA] hours
compared to [DATE]

CLINICAL DATA: Intraoperative lumbar localization

EXAM:
LUMBAR SPINE - 1 VIEW

[lateral]
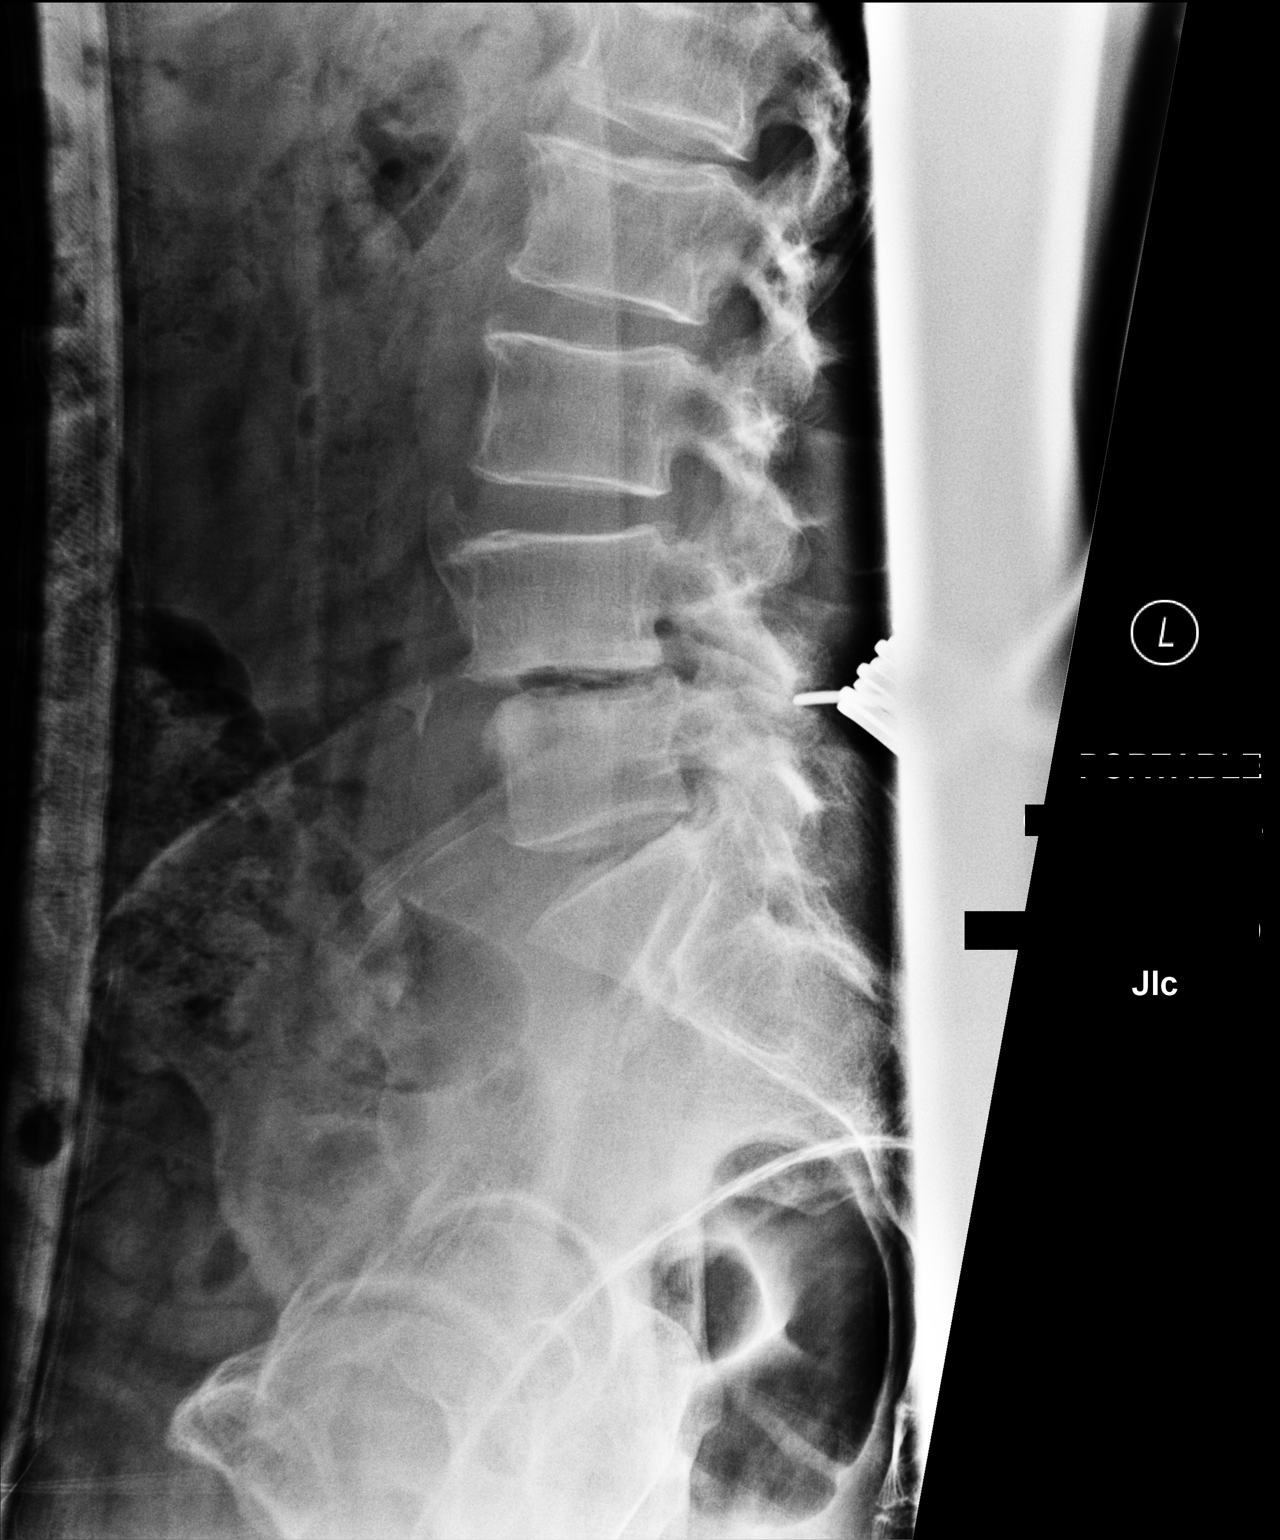

[1 of 1 positions shown; findings below may reference images not displayed]

FINDINGS: 5 lumbar vertebra on prior lumbar radiographs.

Metallic probe via dorsal approach projects dorsal to the superior
L5 level.

Disc space narrowing and vacuum phenomenon at L4-L5 with minimal
anterolisthesis.
IMPRESSION: Dorsal localization of the superior L5 level.

## 2021-04-30 SURGERY — POSTERIOR LUMBAR FUSION 1 LEVEL
Anesthesia: General | Site: Spine Lumbar

## 2021-04-30 MED ORDER — PHENOL 1.4 % MT LIQD: 1.0000 | OROMUCOSAL | Status: DC | PRN

## 2021-04-30 MED ORDER — PHENYLEPHRINE 40 MCG/ML (10ML) SYRINGE FOR IV PUSH (FOR BLOOD PRESSURE SUPPORT)
PREFILLED_SYRINGE | INTRAVENOUS | Status: AC
  Filled 2021-04-30: qty 10

## 2021-04-30 MED ORDER — BUPIVACAINE LIPOSOME 1.3 % IJ SUSP
INTRAMUSCULAR | Status: AC
  Filled 2021-04-30: qty 20

## 2021-04-30 MED ORDER — MOMETASONE FURO-FORMOTEROL FUM 200-5 MCG/ACT IN AERO
2.0000 | INHALATION_SPRAY | Freq: Two times a day (BID) | RESPIRATORY_TRACT | Status: DC
  Administered 2021-04-30 – 2021-05-01 (×2): 2 via RESPIRATORY_TRACT
  Filled 2021-04-30: qty 8.8

## 2021-04-30 MED ORDER — BUPIVACAINE-EPINEPHRINE (PF) 0.5% -1:200000 IJ SOLN
INTRAMUSCULAR | Status: DC | PRN
  Administered 2021-04-30: 10 mL

## 2021-04-30 MED ORDER — THROMBIN 5000 UNITS EX SOLR
CUTANEOUS | Status: AC
  Filled 2021-04-30: qty 5000

## 2021-04-30 MED ORDER — FENTANYL CITRATE (PF) 250 MCG/5ML IJ SOLN
INTRAMUSCULAR | Status: DC | PRN
  Administered 2021-04-30: 250 ug via INTRAVENOUS
  Administered 2021-04-30: 50 ug via INTRAVENOUS

## 2021-04-30 MED ORDER — ONDANSETRON HCL 4 MG/2ML IJ SOLN
4.0000 mg | Freq: Four times a day (QID) | INTRAMUSCULAR | Status: DC | PRN
  Administered 2021-04-30: 4 mg via INTRAVENOUS
  Filled 2021-04-30: qty 2

## 2021-04-30 MED ORDER — LIDOCAINE 2% (20 MG/ML) 5 ML SYRINGE
INTRAMUSCULAR | Status: AC
  Filled 2021-04-30: qty 5

## 2021-04-30 MED ORDER — CLINDAMYCIN PHOSPHATE 600 MG/50ML IV SOLN
600.0000 mg | INTRAVENOUS | Status: AC
  Administered 2021-04-30: 600 mg via INTRAVENOUS
  Filled 2021-04-30: qty 50

## 2021-04-30 MED ORDER — PROMETHAZINE HCL 25 MG/ML IJ SOLN
INTRAMUSCULAR | Status: AC
  Filled 2021-04-30: qty 1

## 2021-04-30 MED ORDER — SODIUM CHLORIDE 0.9% FLUSH: 3.0000 mL | Freq: Two times a day (BID) | INTRAVENOUS | Status: DC

## 2021-04-30 MED ORDER — LIDOCAINE 2% (20 MG/ML) 5 ML SYRINGE
INTRAMUSCULAR | Status: DC | PRN
  Administered 2021-04-30: 60 mg via INTRAVENOUS

## 2021-04-30 MED ORDER — ONDANSETRON HCL 4 MG/2ML IJ SOLN
INTRAMUSCULAR | Status: AC
  Filled 2021-04-30: qty 2

## 2021-04-30 MED ORDER — ROCURONIUM BROMIDE 10 MG/ML (PF) SYRINGE
PREFILLED_SYRINGE | INTRAVENOUS | Status: AC
  Filled 2021-04-30: qty 10

## 2021-04-30 MED ORDER — CYCLOBENZAPRINE HCL 10 MG PO TABS
10.0000 mg | ORAL_TABLET | Freq: Three times a day (TID) | ORAL | Status: DC | PRN
  Administered 2021-04-30: 10 mg via ORAL
  Filled 2021-04-30: qty 1

## 2021-04-30 MED ORDER — CLINDAMYCIN PHOSPHATE 900 MG/50ML IV SOLN
900.0000 mg | Freq: Four times a day (QID) | INTRAVENOUS | Status: AC
  Administered 2021-04-30 – 2021-05-01 (×2): 900 mg via INTRAVENOUS
  Filled 2021-04-30 (×4): qty 50

## 2021-04-30 MED ORDER — DOCUSATE SODIUM 100 MG PO CAPS
100.0000 mg | ORAL_CAPSULE | Freq: Two times a day (BID) | ORAL | Status: DC
  Administered 2021-04-30 – 2021-05-01 (×2): 100 mg via ORAL
  Filled 2021-04-30 (×2): qty 1

## 2021-04-30 MED ORDER — DEXAMETHASONE SODIUM PHOSPHATE 10 MG/ML IJ SOLN
INTRAMUSCULAR | Status: AC
  Filled 2021-04-30: qty 1

## 2021-04-30 MED ORDER — LACTATED RINGERS IV SOLN: INTRAVENOUS | Status: DC

## 2021-04-30 MED ORDER — OXYCODONE HCL 5 MG/5ML PO SOLN: 5.0000 mg | Freq: Once | ORAL | Status: DC | PRN

## 2021-04-30 MED ORDER — MORPHINE SULFATE (PF) 4 MG/ML IV SOLN: 4.0000 mg | INTRAVENOUS | Status: DC | PRN

## 2021-04-30 MED ORDER — CHLORHEXIDINE GLUCONATE 0.12 % MT SOLN: 15.0000 mL | Freq: Once | OROMUCOSAL | Status: AC

## 2021-04-30 MED ORDER — ONDANSETRON HCL 4 MG/2ML IJ SOLN
INTRAMUSCULAR | Status: DC | PRN
  Administered 2021-04-30: 4 mg via INTRAVENOUS

## 2021-04-30 MED ORDER — ACETAMINOPHEN 325 MG PO TABS
650.0000 mg | ORAL_TABLET | ORAL | Status: DC | PRN
  Filled 2021-04-30: qty 2

## 2021-04-30 MED ORDER — ROCURONIUM BROMIDE 10 MG/ML (PF) SYRINGE
PREFILLED_SYRINGE | INTRAVENOUS | Status: DC | PRN
Start: 2021-04-30 — End: 2021-04-30
  Administered 2021-04-30: 10 mg via INTRAVENOUS
  Administered 2021-04-30: 20 mg via INTRAVENOUS
  Administered 2021-04-30: 60 mg via INTRAVENOUS

## 2021-04-30 MED ORDER — PROPOFOL 10 MG/ML IV BOLUS
INTRAVENOUS | Status: AC
  Filled 2021-04-30: qty 20

## 2021-04-30 MED ORDER — ACETAMINOPHEN 500 MG PO TABS
ORAL_TABLET | ORAL | Status: AC
  Administered 2021-04-30: 1000 mg via ORAL
  Filled 2021-04-30: qty 2

## 2021-04-30 MED ORDER — BACITRACIN ZINC 500 UNIT/GM EX OINT
TOPICAL_OINTMENT | CUTANEOUS | Status: AC
  Filled 2021-04-30: qty 28.35

## 2021-04-30 MED ORDER — LORATADINE 10 MG PO TABS
10.0000 mg | ORAL_TABLET | Freq: Every day | ORAL | Status: DC
  Administered 2021-05-01: 10 mg via ORAL
  Filled 2021-04-30: qty 1

## 2021-04-30 MED ORDER — FLUTICASONE PROPIONATE 50 MCG/ACT NA SUSP
1.0000 | Freq: Every day | NASAL | Status: DC
  Filled 2021-04-30: qty 16

## 2021-04-30 MED ORDER — HYDROMORPHONE HCL 1 MG/ML IJ SOLN
0.2500 mg | INTRAMUSCULAR | Status: DC | PRN
  Administered 2021-04-30 (×2): 0.5 mg via INTRAVENOUS

## 2021-04-30 MED ORDER — CHLORHEXIDINE GLUCONATE 0.12 % MT SOLN
OROMUCOSAL | Status: AC
  Administered 2021-04-30: 15 mL via OROMUCOSAL
  Filled 2021-04-30: qty 15

## 2021-04-30 MED ORDER — SUGAMMADEX SODIUM 200 MG/2ML IV SOLN
INTRAVENOUS | Status: DC | PRN
  Administered 2021-04-30 (×2): 100 mg via INTRAVENOUS

## 2021-04-30 MED ORDER — ACETAMINOPHEN 500 MG PO TABS
1000.0000 mg | ORAL_TABLET | Freq: Four times a day (QID) | ORAL | Status: DC
  Administered 2021-04-30 – 2021-05-01 (×3): 1000 mg via ORAL
  Filled 2021-04-30 (×3): qty 2

## 2021-04-30 MED ORDER — ONDANSETRON HCL 4 MG PO TABS: 4.0000 mg | ORAL_TABLET | Freq: Four times a day (QID) | ORAL | Status: DC | PRN

## 2021-04-30 MED ORDER — ACETAMINOPHEN 650 MG RE SUPP: 650.0000 mg | RECTAL | Status: DC | PRN

## 2021-04-30 MED ORDER — MIDAZOLAM HCL 2 MG/2ML IJ SOLN
INTRAMUSCULAR | Status: AC
  Filled 2021-04-30: qty 2

## 2021-04-30 MED ORDER — FENTANYL CITRATE (PF) 250 MCG/5ML IJ SOLN
INTRAMUSCULAR | Status: AC
  Filled 2021-04-30: qty 5

## 2021-04-30 MED ORDER — MIDAZOLAM HCL 5 MG/5ML IJ SOLN
INTRAMUSCULAR | Status: DC | PRN
  Administered 2021-04-30: 2 mg via INTRAVENOUS

## 2021-04-30 MED ORDER — BACITRACIN ZINC 500 UNIT/GM EX OINT
TOPICAL_OINTMENT | CUTANEOUS | Status: DC | PRN
  Administered 2021-04-30: 1 via TOPICAL

## 2021-04-30 MED ORDER — ALBUTEROL SULFATE HFA 108 (90 BASE) MCG/ACT IN AERS
2.0000 | INHALATION_SPRAY | Freq: Four times a day (QID) | RESPIRATORY_TRACT | Status: DC | PRN
  Filled 2021-04-30: qty 6.7

## 2021-04-30 MED ORDER — OXYCODONE HCL 5 MG PO TABS
10.0000 mg | ORAL_TABLET | ORAL | Status: DC | PRN
  Administered 2021-04-30: 10 mg via ORAL
  Administered 2021-04-30: 5 mg via ORAL
  Administered 2021-05-01: 10 mg via ORAL
  Filled 2021-04-30 (×3): qty 2

## 2021-04-30 MED ORDER — PROPOFOL 10 MG/ML IV BOLUS
INTRAVENOUS | Status: DC | PRN
  Administered 2021-04-30: 120 mg via INTRAVENOUS
  Administered 2021-04-30: 30 mg via INTRAVENOUS

## 2021-04-30 MED ORDER — ORAL CARE MOUTH RINSE: 15.0000 mL | Freq: Once | OROMUCOSAL | Status: AC

## 2021-04-30 MED ORDER — OXYCODONE HCL 5 MG PO TABS: 5.0000 mg | ORAL_TABLET | Freq: Once | ORAL | Status: DC | PRN

## 2021-04-30 MED ORDER — HYDROMORPHONE HCL 1 MG/ML IJ SOLN
INTRAMUSCULAR | Status: AC
  Filled 2021-04-30: qty 1

## 2021-04-30 MED ORDER — VANCOMYCIN HCL IN DEXTROSE 1-5 GM/200ML-% IV SOLN: 1000.0000 mg | INTRAVENOUS | Status: AC

## 2021-04-30 MED ORDER — PHENYLEPHRINE 40 MCG/ML (10ML) SYRINGE FOR IV PUSH (FOR BLOOD PRESSURE SUPPORT)
PREFILLED_SYRINGE | INTRAVENOUS | Status: DC | PRN
Start: 2021-04-30 — End: 2021-04-30
  Administered 2021-04-30: 80 ug via INTRAVENOUS
  Administered 2021-04-30: 120 ug via INTRAVENOUS
  Administered 2021-04-30 (×2): 80 ug via INTRAVENOUS

## 2021-04-30 MED ORDER — PHENYLEPHRINE HCL-NACL 20-0.9 MG/250ML-% IV SOLN
INTRAVENOUS | Status: DC | PRN
  Administered 2021-04-30: 20 ug/min via INTRAVENOUS

## 2021-04-30 MED ORDER — MEPERIDINE HCL 25 MG/ML IJ SOLN: 6.2500 mg | INTRAMUSCULAR | Status: DC | PRN

## 2021-04-30 MED ORDER — MIDAZOLAM HCL 2 MG/2ML IJ SOLN: 0.5000 mg | Freq: Once | INTRAMUSCULAR | Status: DC | PRN

## 2021-04-30 MED ORDER — ALBUTEROL SULFATE (2.5 MG/3ML) 0.083% IN NEBU: 2.5000 mg | INHALATION_SOLUTION | Freq: Four times a day (QID) | RESPIRATORY_TRACT | Status: DC | PRN

## 2021-04-30 MED ORDER — PROMETHAZINE HCL 25 MG/ML IJ SOLN: 6.2500 mg | INTRAMUSCULAR | Status: DC | PRN

## 2021-04-30 MED ORDER — VANCOMYCIN HCL IN DEXTROSE 1-5 GM/200ML-% IV SOLN
INTRAVENOUS | Status: AC
  Administered 2021-04-30: 1000 mg via INTRAVENOUS
  Filled 2021-04-30: qty 200

## 2021-04-30 MED ORDER — BISACODYL 10 MG RE SUPP: 10.0000 mg | Freq: Every day | RECTAL | Status: DC | PRN

## 2021-04-30 MED ORDER — CHLORHEXIDINE GLUCONATE CLOTH 2 % EX PADS: 6.0000 | MEDICATED_PAD | Freq: Once | CUTANEOUS | Status: DC

## 2021-04-30 MED ORDER — MONTELUKAST SODIUM 10 MG PO TABS
10.0000 mg | ORAL_TABLET | Freq: Every day | ORAL | Status: DC
  Administered 2021-04-30: 10 mg via ORAL
  Filled 2021-04-30: qty 1

## 2021-04-30 MED ORDER — ALBUTEROL SULFATE HFA 108 (90 BASE) MCG/ACT IN AERS
INHALATION_SPRAY | RESPIRATORY_TRACT | Status: DC | PRN
  Administered 2021-04-30: 2 via RESPIRATORY_TRACT

## 2021-04-30 MED ORDER — DEXAMETHASONE SODIUM PHOSPHATE 10 MG/ML IJ SOLN
INTRAMUSCULAR | Status: DC | PRN
  Administered 2021-04-30: 10 mg via INTRAVENOUS

## 2021-04-30 MED ORDER — VANCOMYCIN HCL 750 MG/150ML IV SOLN
750.0000 mg | Freq: Once | INTRAVENOUS | Status: AC
  Administered 2021-04-30: 750 mg via INTRAVENOUS
  Filled 2021-04-30: qty 150

## 2021-04-30 MED ORDER — SODIUM CHLORIDE 0.9 % IV SOLN: 250.0000 mL | INTRAVENOUS | Status: DC

## 2021-04-30 MED ORDER — OXYCODONE HCL 5 MG PO TABS: 5.0000 mg | ORAL_TABLET | ORAL | Status: DC | PRN

## 2021-04-30 MED ORDER — MENTHOL 3 MG MT LOZG: 1.0000 | LOZENGE | OROMUCOSAL | Status: DC | PRN

## 2021-04-30 MED ORDER — SODIUM CHLORIDE 0.9% FLUSH: 3.0000 mL | INTRAVENOUS | Status: DC | PRN

## 2021-04-30 MED ORDER — ACETAMINOPHEN 500 MG PO TABS: 1000.0000 mg | ORAL_TABLET | Freq: Once | ORAL | Status: AC

## 2021-04-30 MED ORDER — HYDROXYZINE HCL 50 MG/ML IM SOLN
50.0000 mg | Freq: Four times a day (QID) | INTRAMUSCULAR | Status: DC | PRN
  Administered 2021-04-30: 50 mg via INTRAMUSCULAR
  Filled 2021-04-30: qty 1

## 2021-04-30 MED ORDER — 0.9 % SODIUM CHLORIDE (POUR BTL) OPTIME
TOPICAL | Status: DC | PRN
  Administered 2021-04-30: 1000 mL

## 2021-04-30 MED ORDER — THROMBIN 5000 UNITS EX SOLR
OROMUCOSAL | Status: DC | PRN
  Administered 2021-04-30: 5 mL via TOPICAL

## 2021-04-30 MED ORDER — BUPIVACAINE-EPINEPHRINE 0.5% -1:200000 IJ SOLN
INTRAMUSCULAR | Status: AC
  Filled 2021-04-30: qty 1

## 2021-04-30 SURGICAL SUPPLY — 72 items
ADH SKN CLS APL DERMABOND .7 (GAUZE/BANDAGES/DRESSINGS) ×1
APL SKNCLS STERI-STRIP NONHPOA (GAUZE/BANDAGES/DRESSINGS) ×1
BAG COUNTER SPONGE SURGICOUNT (BAG) ×2 IMPLANT
BAG SPNG CNTER NS LX DISP (BAG) ×1
BASKET BONE COLLECTION (BASKET) ×2 IMPLANT
BENZOIN TINCTURE PRP APPL 2/3 (GAUZE/BANDAGES/DRESSINGS) ×2 IMPLANT
BLADE CLIPPER SURG (BLADE) IMPLANT
BUR MATCHSTICK NEURO 3.0 LAGG (BURR) ×2 IMPLANT
BUR PRECISION FLUTE 6.0 (BURR) ×2 IMPLANT
CAGE ALTERA 10X31X9-13 15D (Cage) ×1 IMPLANT
CANISTER SUCT 3000ML PPV (MISCELLANEOUS) ×2 IMPLANT
CAP LOCK DLX THRD (Cap) ×4 IMPLANT
CARTRIDGE OIL MAESTRO DRILL (MISCELLANEOUS) ×1 IMPLANT
CLSR STERI-STRIP ANTIMIC 1/2X4 (GAUZE/BANDAGES/DRESSINGS) ×1 IMPLANT
CNTNR URN SCR LID CUP LEK RST (MISCELLANEOUS) ×1 IMPLANT
CONT SPEC 4OZ STRL OR WHT (MISCELLANEOUS) ×2
COVER BACK TABLE 60X90IN (DRAPES) ×2 IMPLANT
DECANTER SPIKE VIAL GLASS SM (MISCELLANEOUS) ×2 IMPLANT
DERMABOND ADVANCED (GAUZE/BANDAGES/DRESSINGS) ×1
DERMABOND ADVANCED .7 DNX12 (GAUZE/BANDAGES/DRESSINGS) IMPLANT
DIFFUSER DRILL AIR PNEUMATIC (MISCELLANEOUS) ×2 IMPLANT
DRAPE C-ARM 42X72 X-RAY (DRAPES) ×4 IMPLANT
DRAPE HALF SHEET 40X57 (DRAPES) ×2 IMPLANT
DRAPE LAPAROTOMY 100X72X124 (DRAPES) ×2 IMPLANT
DRAPE SURG 17X23 STRL (DRAPES) ×8 IMPLANT
DRSG OPSITE POSTOP 4X6 (GAUZE/BANDAGES/DRESSINGS) ×2 IMPLANT
ELECT BLADE 4.0 EZ CLEAN MEGAD (MISCELLANEOUS) ×2
ELECT REM PT RETURN 9FT ADLT (ELECTROSURGICAL) ×2
ELECTRODE BLDE 4.0 EZ CLN MEGD (MISCELLANEOUS) ×1 IMPLANT
ELECTRODE REM PT RTRN 9FT ADLT (ELECTROSURGICAL) ×1 IMPLANT
EVACUATOR 1/8 PVC DRAIN (DRAIN) IMPLANT
GAUZE 4X4 16PLY ~~LOC~~+RFID DBL (SPONGE) ×2 IMPLANT
GLOVE EXAM NITRILE XL STR (GLOVE) IMPLANT
GLOVE SURG ENC MOIS LTX SZ8 (GLOVE) ×4 IMPLANT
GLOVE SURG ENC MOIS LTX SZ8.5 (GLOVE) ×4 IMPLANT
GLOVE SURG UNDER POLY LF SZ6 (GLOVE) ×2 IMPLANT
GLOVE SURG UNDER POLY LF SZ7.5 (GLOVE) ×3 IMPLANT
GOWN STRL REUS W/ TWL LRG LVL3 (GOWN DISPOSABLE) IMPLANT
GOWN STRL REUS W/ TWL XL LVL3 (GOWN DISPOSABLE) ×2 IMPLANT
GOWN STRL REUS W/TWL 2XL LVL3 (GOWN DISPOSABLE) IMPLANT
GOWN STRL REUS W/TWL LRG LVL3 (GOWN DISPOSABLE) ×6
GOWN STRL REUS W/TWL XL LVL3 (GOWN DISPOSABLE) ×4
HEMOSTAT POWDER KIT SURGIFOAM (HEMOSTASIS) ×2 IMPLANT
KIT BASIN OR (CUSTOM PROCEDURE TRAY) ×2 IMPLANT
KIT GRAFTMAG DEL NEURO DISP (NEUROSURGERY SUPPLIES) ×1 IMPLANT
KIT TURNOVER KIT B (KITS) ×2 IMPLANT
MILL MEDIUM DISP (BLADE) ×1 IMPLANT
NDL HYPO 21X1.5 SAFETY (NEEDLE) IMPLANT
NEEDLE HYPO 21X1.5 SAFETY (NEEDLE) ×2 IMPLANT
NEEDLE HYPO 22GX1.5 SAFETY (NEEDLE) ×2 IMPLANT
NS IRRIG 1000ML POUR BTL (IV SOLUTION) ×2 IMPLANT
OIL CARTRIDGE MAESTRO DRILL (MISCELLANEOUS) ×2
PACK LAMINECTOMY NEURO (CUSTOM PROCEDURE TRAY) ×2 IMPLANT
PAD ARMBOARD 7.5X6 YLW CONV (MISCELLANEOUS) ×6 IMPLANT
PATTIES SURGICAL .5 X1 (DISPOSABLE) IMPLANT
PUTTY DBM 10CC CALC GRAN (Putty) ×1 IMPLANT
ROD CREO DLX CVD 6.35X40 (Rod) IMPLANT
ROD CURVED TI 6.35X40 (Rod) ×4 IMPLANT
SCREW PA DLX CREO 7.5X50 (Screw) ×2 IMPLANT
SCREW PA DLX CREO 7.5X55 (Screw) ×2 IMPLANT
SPONGE NEURO XRAY DETECT 1X3 (DISPOSABLE) IMPLANT
SPONGE SURGIFOAM ABS GEL 100 (HEMOSTASIS) IMPLANT
SPONGE T-LAP 4X18 ~~LOC~~+RFID (SPONGE) ×1 IMPLANT
STRIP CLOSURE SKIN 1/2X4 (GAUZE/BANDAGES/DRESSINGS) ×2 IMPLANT
SUT VIC AB 1 CT1 18XBRD ANBCTR (SUTURE) ×2 IMPLANT
SUT VIC AB 1 CT1 8-18 (SUTURE) ×4
SUT VIC AB 2-0 CP2 18 (SUTURE) ×4 IMPLANT
SYR 20ML LL LF (SYRINGE) ×1 IMPLANT
TOWEL GREEN STERILE (TOWEL DISPOSABLE) ×2 IMPLANT
TOWEL GREEN STERILE FF (TOWEL DISPOSABLE) ×2 IMPLANT
TRAY FOLEY MTR SLVR 16FR STAT (SET/KITS/TRAYS/PACK) ×2 IMPLANT
WATER STERILE IRR 1000ML POUR (IV SOLUTION) ×2 IMPLANT

## 2021-04-30 NOTE — Progress Notes (Signed)
Orthopedic Tech Progress Note Patient Details:  Adam Newman Feb 21, 1966 [id number]  Ortho Devices Type of Ortho Device: Lumbar corsett Ortho Device/Splint Interventions: Ordered      [name] 04/30/2021, 4:11 PM

## 2021-04-30 NOTE — Anesthesia Postprocedure Evaluation (Signed)
Anesthesia Post Note  Patient: Adam Newman  Procedure(s) Performed: POSTERIOR LUMBAR INTERBODY FUSION,POSTERIOR INSTRUMENTATION, LUMBAR FOUR-LUMBAR FIVE (Spine Lumbar)     Patient location during evaluation: PACU Anesthesia Type: General Level of consciousness: awake and alert, patient cooperative and oriented Pain management: pain level controlled Vital Signs Assessment: post-procedure vital signs reviewed and stable Respiratory status: spontaneous breathing, nonlabored ventilation and respiratory function stable Cardiovascular status: blood pressure returned to baseline and stable Postop Assessment: no apparent nausea or vomiting Anesthetic complications: no   No notable events documented.  Last Vitals:  Vitals:   04/30/21 1610 04/30/21 1638  BP: 128/81 127/78  Pulse: 84 82  Resp: 18 18  Temp: 36.8 C (!) 36.4 C  SpO2: 98% 99%    Last Pain:  Vitals:   04/30/21 1638  TempSrc: Oral  PainSc:                  Grantham Hippert,E. Ronneisha Jett

## 2021-04-30 NOTE — Op Note (Signed)
Brief history: The patient is a 56 year old white male who has complained of back and bilateral leg pain consistent with neurogenic claudication/lumbar radiculopathy.  He has failed medical management and was worked up with a lumbar MRI and lumbar x-rays which demonstrated L4-5 degenerative disease, facet arthropathy, spondylolisthesis, spinal stenosis, etc.  I discussed the various treatment options with him.  He has decided proceed with surgery.  Preoperative diagnosis: L4-5 spondylolisthesis, facet arthropathy, degenerative disc disease, spinal stenosis compressing both the L4 and the L5 nerve roots; lumbago; lumbar radiculopathy; neurogenic claudication  Postoperative diagnosis: The same  Procedure: Bilateral L4-5 laminotomy/foraminotomies/medial facetectomy to decompress the bilateral L4 and L5 nerve roots(the work required to do this was in addition to the work required to do the posterior lumbar interbody fusion because of the patient's spinal stenosis, facet arthropathy. Etc. requiring a wide decompression of the nerve roots.);  L4-5 transforaminal lumbar interbody fusion with local morselized autograft bone and Zimmer DBM; insertion of interbody prosthesis at L4-5 (globus peek expandable interbody prosthesis); posterior nonsegmental instrumentation from L4 to L5 with globus titanium pedicle screws and rods; posterior lateral arthrodesis at L4-5 with local morselized autograft bone and Zimmer DBM.  Surgeon: Dr. Delma Officer  Asst.: Dr. Lisbeth Renshaw  Anesthesia: Gen. endotracheal  Estimated blood loss: 250 cc  Drains: None  Complications: None  Description of procedure: The patient was brought to the operating room by the anesthesia team. General endotracheal anesthesia was induced. The patient was turned to the prone position on the Wilson frame. The patient's lumbosacral region was then prepared with Betadine scrub and Betadine solution. Sterile drapes were applied.  I then  injected the area to be incised with Marcaine with epinephrine solution. I then used the scalpel to make a linear midline incision over the L4-5 interspace. I then used electrocautery to perform a bilateral subperiosteal dissection exposing the spinous process and lamina of L4-5. We then obtained intraoperative radiograph to confirm our location. We then inserted the Verstrac retractor to provide exposure.  I began the decompression by using the high speed drill to perform laminotomies at L4-5 bilaterally. We then used the Kerrison punches to widen the laminotomy and removed the ligamentum flavum at L4-5 bilaterally. We used the Kerrison punches to remove the medial facets at L4-5 bilaterally. We performed wide foraminotomies about the bilateral L4 and L5 nerve roots completing the decompression.  We now turned our attention to the posterior lumbar interbody fusion. I used a scalpel to incise the intervertebral disc at L4-5 bilaterally. I then performed a partial intervertebral discectomy at L4-5 bilaterally using the pituitary forceps. We prepared the vertebral endplates at L4-5 bilaterally for the fusion by removing the soft tissues with the curettes. We then used the trial spacers to pick the appropriate sized interbody prosthesis. We prefilled his prosthesis with a combination of local morselized autograft bone that we obtained during the decompression as well as Zimmer DBM. We inserted the prefilled prosthesis into the interspace at L4-5, we then turned and expanded the prosthesis. There was a good snug fit of the prosthesis in the interspace. We then filled and the remainder of the intervertebral disc space with local morselized autograft bone and Zimmer DBM. This completed the posterior lumbar interbody arthrodesis.  During the decompression and insertion of the prosthesis the assistant protected the thecal sac and nerve roots with the D'Errico retractor.  We now turned attention to the instrumentation.  Under fluoroscopic guidance we cannulated the bilateral L4 and L5 pedicles with the bone probe.  We then removed the bone probe. We then tapped the pedicle with a 6.5 millimeter tap. We then removed the tap. We probed inside the tapped pedicle with a ball probe to rule out cortical breaches. We then inserted a 7.5 x 50 and 55 millimeter pedicle screw into the L4 and L5 pedicles bilaterally under fluoroscopic guidance. We then palpated along the medial aspect of the pedicles to rule out cortical breaches. There were none. The nerve roots were not injured. We then connected the unilateral pedicle screws with a lordotic rod. We compressed the construct and secured the rod in place with the caps. We then tightened the caps appropriately. This completed the instrumentation from L4-5 bilaterally.  We now turned our attention to the posterior lateral arthrodesis at L4-5. We used the high-speed drill to decorticate the remainder of the facets, pars, transverse process at L4-5. We then applied a combination of local morselized autograft bone and Zimmer DBM over these decorticated posterior lateral structures. This completed the posterior lateral arthrodesis.  We then obtained hemostasis using bipolar electrocautery. We irrigated the wound out with bacitracin solution. We inspected the thecal sac and nerve roots and noted they were well decompressed. We then removed the retractor.  We injected Exparel . We reapproximated patient's thoracolumbar fascia with interrupted #1 Vicryl suture. We reapproximated patient's subcutaneous tissue with interrupted 2-0 Vicryl suture. The reapproximated patient's skin with Steri-Strips and benzoin. The wound was then coated with bacitracin ointment. A sterile dressing was applied. The drapes were removed. The patient was subsequently returned to the supine position where they were extubated by the anesthesia team. He was then transported to the post anesthesia care unit in stable  condition. All sponge instrument and needle counts were reportedly correct at the end of this case.

## 2021-04-30 NOTE — Transfer of Care (Signed)
Immediate Anesthesia Transfer of Care Note  Patient: Sabastien Tesler  Procedure(s) Performed: POSTERIOR LUMBAR INTERBODY FUSION,POSTERIOR INSTRUMENTATION, LUMBAR FOUR-LUMBAR FIVE (Spine Lumbar)  Patient Location: PACU  Anesthesia Type:General  Level of Consciousness: awake, oriented and patient cooperative  Airway & Oxygen Therapy: Patient Spontanous Breathing and Patient connected to nasal cannula oxygen  Post-op Assessment: Report given to RN and Post -op Vital signs reviewed and stable  Post vital signs: Reviewed  Last Vitals:  Vitals Value Taken Time  BP 155/94 04/30/21 1508  Temp    Pulse 94 04/30/21 1511  Resp 26 04/30/21 1511  SpO2 100 % 04/30/21 1511  Vitals shown include unvalidated device data.  Last Pain:  Vitals:   04/30/21 1004  TempSrc:   PainSc: 6       Patients Stated Pain Goal: 3 (04/30/21 1004)  Complications: No notable events documented.

## 2021-04-30 NOTE — Anesthesia Procedure Notes (Signed)
Procedure Name: Intubation Date/Time: 04/30/2021 11:47 AM Performed by: Marny Lowenstein, CRNA Pre-anesthesia Checklist: Patient identified, Emergency Drugs available, Suction available and Patient being monitored Patient Re-evaluated:Patient Re-evaluated prior to induction Oxygen Delivery Method: Circle system utilized Preoxygenation: Pre-oxygenation with 100% oxygen Induction Type: IV induction Ventilation: Mask ventilation without difficulty Laryngoscope Size: Miller and 3 Grade View: Grade I Tube type: Oral Tube size: 7.5 mm Number of attempts: 1 Airway Equipment and Method: Patient positioned with wedge pillow and Stylet Placement Confirmation: ETT inserted through vocal cords under direct vision, positive ETCO2 and breath sounds checked- equal and bilateral Secured at: 23 cm Tube secured with: Tape Dental Injury: Teeth and Oropharynx as per pre-operative assessment

## 2021-04-30 NOTE — H&P (Signed)
Subjective: The patient is a 56 year old white male who is complained of back and bilateral leg pain consistent with neurogenic claudication.  He has failed medical management and was worked up with a lumbar MRI and lumbar x-rays which demonstrated an L4-5 spondylolisthesis, facet arthropathy, spinal stenosis, etc.  I discussed the various treatment options with him.  He has decided to proceed with surgery.  Past Medical History:  Diagnosis Date   Asthma    Hypertension     Past Surgical History:  Procedure Laterality Date   NASAL SEPTUM SURGERY Bilateral     Allergies  Allergen Reactions   Penicillins Other (See Comments)    Unknown reaction    Social History   Tobacco Use   Smoking status: Some Days    Packs/day: 0.25    Types: Cigarettes   Smokeless tobacco: Current    Types: Snuff  Substance Use Topics   Alcohol use: Not Currently    Family History  Problem Relation Age of Onset   Diabetes Mother    Diabetes Father    Hemochromatosis Father    Prior to Admission medications   Medication Sig Start Date End Date Taking? Authorizing Provider  acetaminophen (TYLENOL) 500 MG tablet Take 500 mg by mouth every 6 (six) hours as needed.   Yes [provider]  ADVAIR DISKUS 250-50 MCG/ACT AEPB Inhale 1 puff into the lungs 2 (two) times daily. 03/14/21  Yes [provider]  albuterol (PROVENTIL HFA;VENTOLIN HFA) 108 (90 Base) MCG/ACT inhaler Inhale 2 puffs into the lungs every 6 (six) hours as needed for wheezing or shortness of breath. 12/14/15  Yes Jene Every, MD  celecoxib (CELEBREX) 200 MG capsule Take 200 mg by mouth 2 (two) times daily. 12/24/20  Yes [provider]  fexofenadine (ALLEGRA) 180 MG tablet Take 180 mg by mouth in the morning.   Yes [provider]  fluticasone (FLONASE) 50 MCG/ACT nasal spray Place 2 sprays into the nose in the morning.   Yes [provider]  montelukast (SINGULAIR) 10 MG tablet Take 10 mg by mouth  at bedtime. 03/16/21  Yes [provider]  sildenafil (VIAGRA) 50 MG tablet Take 50 mg by mouth daily as needed for erectile dysfunction.   Yes [provider]  albuterol (PROVENTIL) (2.5 MG/3ML) 0.083% nebulizer solution Take 3 mLs (2.5 mg total) by nebulization every 6 (six) hours as needed for wheezing or shortness of breath. 12/14/15   Jene Every, MD     Review of Systems  Positive ROS: As above  All other systems have been reviewed and were otherwise negative with the exception of those mentioned in the HPI and as above.  Objective: Vital signs in last 24 hours: Temp:  [98.5 F (36.9 C)] 98.5 F (36.9 C) (01/26 0953) Pulse Rate:  [73] 73 (01/26 0953) Resp:  [18] 18 (01/26 0953) BP: (150-154)/(90-100) 150/90 (01/26 1004) SpO2:  [97 %] 97 % (01/26 0953) Weight:  [67.1 kg] 67.1 kg (01/26 0953) Estimated body mass index is 22.5 kg/m as calculated from the following:   Height as of this encounter: 5\' 8"  (1.727 m).   Weight as of this encounter: 67.1 kg.   General Appearance: Alert Head: Normocephalic, without obvious abnormality, atraumatic Eyes: PERRL, conjunctiva/corneas clear, EOM's intact,    Ears: Normal  Throat: Normal  Neck: Supple, Back: unremarkable Lungs: Clear to auscultation bilaterally, respirations unlabored Heart: Regular rate and rhythm, no murmur, rub or gallop Abdomen: Soft, non-tender Extremities: Extremities normal, atraumatic, no cyanosis or  edema Skin: unremarkable  NEUROLOGIC:   Mental status: alert and oriented,Motor Exam - grossly normal Sensory Exam - grossly normal Reflexes:  Coordination - grossly normal Gait - grossly normal Balance - grossly normal Cranial Nerves: I: smell Not tested  II: visual acuity  OS: Normal  OD: Normal   II: visual fields Full to confrontation  II: pupils Equal, round, reactive to light  III,VII: ptosis None  III,IV,VI: extraocular muscles  Full ROM  V: mastication Normal  V: facial  light touch sensation  Normal  V,VII: corneal reflex  Present  VII: facial muscle function - upper  Normal  VII: facial muscle function - lower Normal  VIII: hearing Not tested  IX: soft palate elevation  Normal  IX,X: gag reflex Present  XI: trapezius strength  5/5  XI: sternocleidomastoid strength 5/5  XI: neck flexion strength  5/5  XII: tongue strength  Normal    Data Review Lab Results  Component Value Date   WBC 13.0 (H) 04/27/2021   HGB 15.4 04/27/2021   HCT 47.4 04/27/2021   MCV 94.8 04/27/2021   PLT 430 (H) 04/27/2021   Lab Results  Component Value Date   NA 140 04/27/2021   K 4.8 04/27/2021   CL 102 04/27/2021   CO2 29 04/27/2021   BUN 12 04/27/2021   CREATININE 1.01 04/27/2021   GLUCOSE 114 (H) 04/27/2021   No results found for: INR, PROTIME  Assessment/Plan: L4-5 spondylolisthesis, spinal stenosis, facet arthropathy, lumbago, lumbar radiculopathy, neurogenic claudication: I have discussed situation with the patient.  I reviewed his imaging studies with him and pointed out the abnormalities.  We have discussed the various treatment options including surgery.  I have described the surgical treatment option of an L4-5 decompression, instrumentation and fusion.  I have shown him surgical models.  I have given him a surgical pamphlet.  We have discussed the risk, benefits, alternatives, expected postop course, and likelihood of achieving our goals with surgery.  I have answered all his questions.  He has decided to proceed with surgery.   Cristi Loron 04/30/2021 10:44 AM

## 2021-04-30 NOTE — Progress Notes (Signed)
No drain per Rn post procedure. Vanc 750mg  IV x1  Onnie Boer, PharmD, BCIDP, AAHIVP, CPP Infectious Disease Pharmacist 04/30/2021 5:02 PM

## 2021-05-01 ENCOUNTER — Other Ambulatory Visit: Payer: Self-pay

## 2021-05-01 DIAGNOSIS — M4316 Spondylolisthesis, lumbar region: Secondary | ICD-10-CM | POA: Diagnosis not present

## 2021-05-01 LAB — CBC
HCT: 35.4 % — ABNORMAL LOW (ref 39.0–52.0)
Hemoglobin: 11.7 g/dL — ABNORMAL LOW (ref 13.0–17.0)
MCH: 30.8 pg (ref 26.0–34.0)
MCHC: 33.1 g/dL (ref 30.0–36.0)
MCV: 93.2 fL (ref 80.0–100.0)
Platelets: 331 10*3/uL (ref 150–400)
RBC: 3.8 MIL/uL — ABNORMAL LOW (ref 4.22–5.81)
RDW: 12.4 % (ref 11.5–15.5)
WBC: 18.7 10*3/uL — ABNORMAL HIGH (ref 4.0–10.5)
nRBC: 0 % (ref 0.0–0.2)

## 2021-05-01 LAB — BASIC METABOLIC PANEL
Anion gap: 7 (ref 5–15)
BUN: 8 mg/dL (ref 6–20)
CO2: 25 mmol/L (ref 22–32)
Calcium: 8.4 mg/dL — ABNORMAL LOW (ref 8.9–10.3)
Chloride: 106 mmol/L (ref 98–111)
Creatinine, Ser: 0.84 mg/dL (ref 0.61–1.24)
GFR, Estimated: >60 mL/min (ref >60)
Glucose, Bld: 111 mg/dL — ABNORMAL HIGH (ref 70–99)
Potassium: 3.7 mmol/L (ref 3.5–5.1)
Sodium: 138 mmol/L (ref 135–145)

## 2021-05-01 MED ORDER — CYCLOBENZAPRINE HCL 10 MG PO TABS: 10.0000 mg | ORAL_TABLET | Freq: Three times a day (TID) | ORAL | 0 refills | Status: AC | PRN

## 2021-05-01 MED ORDER — DOCUSATE SODIUM 100 MG PO CAPS: 100.0000 mg | ORAL_CAPSULE | Freq: Two times a day (BID) | ORAL | 0 refills | Status: AC

## 2021-05-01 MED ORDER — OXYCODONE-ACETAMINOPHEN 5-325 MG PO TABS
1.0000 | ORAL_TABLET | ORAL | Status: DC | PRN
  Administered 2021-05-01: 2 via ORAL
  Filled 2021-05-01: qty 2

## 2021-05-01 MED ORDER — OXYCODONE-ACETAMINOPHEN 5-325 MG PO TABS
1.0000 | ORAL_TABLET | ORAL | 0 refills | Status: AC | PRN
Start: 2021-05-01 — End: ?

## 2021-05-01 NOTE — Discharge Summary (Signed)
Physician Discharge Summary  Patient ID: Adam Newman MRN: [id number] DOB/AGE: 1965/06/21 56 y.o.  Admit date: 04/30/2021 Discharge date: 05/01/2021  Admission Diagnoses: Lumbar spondylolisthesis, lumbar facet arthropathy, lumbar spinal stenosis, lumbar radiculopathy, lumbago, neurogenic claudication  Discharge Diagnoses: The same Principal Problem:   Spondylolisthesis of lumbar region   Discharged Condition: good  Hospital Course: I performed an L4-5 decompression, instrumentation and fusion on the patient on 04/30/2021.  The surgery went well.  The patient's postoperative course was unremarkable.  On postoperative day #1 he requested discharge to home.  He was given verbal and written discharge instructions.  All his questions were answered.  Consults: PT, OT, care management Significant Diagnostic Studies: None Treatments: L4-5 decompression, instrumentation and fusion. Discharge Exam: Blood pressure 128/79, pulse 97, temperature 99 F (37.2 C), temperature source Oral, resp. rate 16, height 5\' 8"  (1.727 m), weight 67.1 kg, SpO2 100 %. The patient is alert and pleasant.  He looks well.  His strength is normal.  Disposition: Home  Discharge Instructions     Call MD for:  difficulty breathing, headache or visual disturbances   Complete by: As directed    Call MD for:  extreme fatigue   Complete by: As directed    Call MD for:  hives   Complete by: As directed    Call MD for:  persistant dizziness or light-headedness   Complete by: As directed    Call MD for:  persistant nausea and vomiting   Complete by: As directed    Call MD for:  redness, tenderness, or signs of infection (pain, swelling, redness, odor or green/yellow discharge around incision site)   Complete by: As directed    Call MD for:  severe uncontrolled pain   Complete by: As directed    Call MD for:  temperature >100.4   Complete by: As directed    Diet - low sodium heart healthy   Complete by: As  directed    Discharge instructions   Complete by: As directed    Call [phone] for a followup appointment. Take a stool softener while you are using pain medications.   Driving Restrictions   Complete by: As directed    Do not drive for 2 weeks.   Increase activity slowly   Complete by: As directed    Lifting restrictions   Complete by: As directed    Do not lift more than 5 pounds. No excessive bending or twisting.   May shower / Bathe   Complete by: As directed    Remove the dressing for 3 days after surgery.  You may shower, but leave the incision alone.   Remove dressing in 48 hours   Complete by: As directed       Allergies as of 05/01/2021       Reactions   Penicillins Other (See Comments)   Unknown reaction        Medication List     STOP taking these medications    acetaminophen 500 MG tablet Commonly known as: TYLENOL   celecoxib 200 MG capsule Commonly known as: CELEBREX       TAKE these medications    Advair Diskus 250-50 MCG/ACT Aepb Generic drug: fluticasone-salmeterol Inhale 1 puff into the lungs 2 (two) times daily.   albuterol 108 (90 Base) MCG/ACT inhaler Commonly known as: VENTOLIN HFA Inhale 2 puffs into the lungs every 6 (six) hours as needed for wheezing or shortness of breath.   albuterol (2.5 MG/3ML) 0.083% nebulizer solution Commonly known  as: PROVENTIL Take 3 mLs (2.5 mg total) by nebulization every 6 (six) hours as needed for wheezing or shortness of breath.   cyclobenzaprine 10 MG tablet Commonly known as: FLEXERIL Take 1 tablet (10 mg total) by mouth 3 (three) times daily as needed for muscle spasms.   docusate sodium 100 MG capsule Commonly known as: COLACE Take 1 capsule (100 mg total) by mouth 2 (two) times daily.   fexofenadine 180 MG tablet Commonly known as: ALLEGRA Take 180 mg by mouth in the morning.   fluticasone 50 MCG/ACT nasal spray Commonly known as: FLONASE Place 2 sprays into the nose in the  morning.   montelukast 10 MG tablet Commonly known as: SINGULAIR Take 10 mg by mouth at bedtime.   oxyCODONE-acetaminophen 5-325 MG tablet Commonly known as: PERCOCET/ROXICET Take 1-2 tablets by mouth every 4 (four) hours as needed for moderate pain.   sildenafil 50 MG tablet Commonly known as: VIAGRA Take 50 mg by mouth daily as needed for erectile dysfunction.         Signed: Cristi Loron 05/01/2021, 7:59 AM

## 2021-05-01 NOTE — Progress Notes (Signed)
Patient alert and oriented, voiding adequately, MAE well with no difficulty. Incision area cdi with no s/s of infection. Patient discharged home per order. Patient and husband stated understanding of discharge instructions given. Patient has an appointment with Dr. Lovell Sheehan, Patient waiting for ride home

## 2021-05-01 NOTE — Plan of Care (Signed)
Adequately Ready for Discharge 

## 2021-05-01 NOTE — Evaluation (Signed)
Physical Therapy Evaluation and Discharge Patient Details Name: Adam Newman MRN: [id number] DOB: 27-Mar-1966 Today's Date: 05/01/2021  History of Present Illness  This 56 y/o male s/p L4-5 transforaminal lumbar interbody fusion on 04/30/2021. PMH significant for HTN.  Clinical Impression  Patient evaluated by Physical Therapy with no further acute PT needs identified. All education has been completed and the patient has no further questions. Pt was able to demonstrate transfers and ambulation with gross modified independence and no AD. Pt was educated on precautions, brace application/wearing schedule, appropriate activity progression, and car transfer. See below for any follow-up Physical Therapy or equipment needs. PT is signing off. Thank you for this referral.        Recommendations for follow up therapy are one component of a multi-disciplinary discharge planning process, led by the attending physician.  Recommendations may be updated based on patient status, additional functional criteria and insurance authorization.  Follow Up Recommendations No PT follow up    Assistance Recommended at Discharge PRN  Patient can return home with the following  Assist for transportation    Equipment Recommendations None recommended by PT  Recommendations for Other Services       Functional Status Assessment Patient has had a recent decline in their functional status and demonstrates the ability to make significant improvements in function in a reasonable and predictable amount of time.     Precautions / Restrictions Precautions Precautions: Back;Fall Precaution Booklet Issued: Yes (comment) Required Braces or Orthoses: Spinal Brace Spinal Brace: Lumbar corset;Applied in sitting position Restrictions Weight Bearing Restrictions: No      Mobility  Bed Mobility Overal bed mobility: Modified Independent                  Transfers Overall transfer level: Independent Equipment  used: None               General transfer comment: No assist required. Light cues for improved posture.    Ambulation/Gait Ambulation/Gait assistance: Modified independent (Device/Increase time) Gait Distance (Feet): 560 Feet Assistive device: None Gait Pattern/deviations: Step-through pattern, Decreased stride length, Trunk flexed Gait velocity: Decreased Gait velocity interpretation: 1.31 - 2.62 ft/sec, indicative of limited community ambulator   General Gait Details: VC's for improved posture and forward gaze.  Stairs            Wheelchair Mobility    Modified Rankin (Stroke Patients Only)       Balance Overall balance assessment: Modified Independent                                           Pertinent Vitals/Pain Pain Assessment Pain Assessment: Faces Faces Pain Scale: Hurts little more Pain Location: incisonal Pain Descriptors / Indicators: Operative site guarding (stiff) Pain Intervention(s): Limited activity within patient's tolerance, Monitored during session, Repositioned    Home Living Family/patient expects to be discharged to:: Private residence Living Arrangements: Spouse/significant other Available Help at Discharge: Family;Available PRN/intermittently (24 hours next 3 days) Type of Home: House Home Access: Level entry       Home Layout: One level Home Equipment: None Additional Comments: adustable bed    Prior Function Prior Level of Function : Independent/Modified Independent                     Hand Dominance   Dominant Hand: Right    Extremity/Trunk Assessment   Upper Extremity  Assessment Upper Extremity Assessment: Defer to OT evaluation    Lower Extremity Assessment Lower Extremity Assessment: Generalized weakness    Cervical / Trunk Assessment Cervical / Trunk Assessment: Back Surgery  Communication   Communication: No difficulties  Cognition Arousal/Alertness: Awake/alert Behavior During  Therapy: WFL for tasks assessed/performed Overall Cognitive Status: Within Functional Limits for tasks assessed                                          General Comments      Exercises     Assessment/Plan    PT Assessment Patient does not need any further PT services  PT Problem List         PT Treatment Interventions      PT Goals (Current goals can be found in the Care Plan section)  Acute Rehab PT Goals Patient Stated Goal: Home today, improve posture PT Goal Formulation: All assessment and education complete, DC therapy    Frequency       Co-evaluation               AM-PAC PT "6 Clicks" Mobility  Outcome Measure Help needed turning from your back to your side while in a flat bed without using bedrails?: None Help needed moving from lying on your back to sitting on the side of a flat bed without using bedrails?: None Help needed moving to and from a bed to a chair (including a wheelchair)?: None Help needed standing up from a chair using your arms (e.g., wheelchair or bedside chair)?: None Help needed to walk in hospital room?: None Help needed climbing 3-5 steps with a railing? : A Little 6 Click Score: 23    End of Session Equipment Utilized During Treatment: Back brace Activity Tolerance: Patient tolerated treatment well Patient left: in bed;with call bell/phone within reach Nurse Communication: Mobility status PT Visit Diagnosis: Unsteadiness on feet (R26.81);Pain Pain - part of body:  (back)    Time: 6967-8938 PT Time Calculation (min) (ACUTE ONLY): 15 min   Charges:   PT Evaluation $PT Eval Low Complexity: 1 Low          Conni Slipper, PT, DPT Acute Rehabilitation Services Pager: [phone] Office: [phone]   Marylynn Pearson 05/01/2021, 10:00 AM

## 2021-05-01 NOTE — Evaluation (Signed)
Occupational Therapy Evaluation and Discharge Patient Details Name: Adam Newman MRN: [id number] DOB: 08/15/1965 Today's Date: 05/01/2021   History of Present Illness This 56 yo male s/ p L4-5 transforaminal lumbar interbody fusion.   Clinical Impression   This 56 yo male admitted and underwent above presents to acute OT with all education completed, we will D/C from acute OT.      Recommendations for follow up therapy are one component of a multi-disciplinary discharge planning process, led by the attending physician.  Recommendations may be updated based on patient status, additional functional criteria and insurance authorization.   Follow Up Recommendations  No OT follow up    Assistance Recommended at Discharge PRN  Patient can return home with the following Assistance with cooking/housework    Functional Status Assessment  Patient has had a recent decline in their functional status and demonstrates the ability to make significant improvements in function in a reasonable and predictable amount of time. (no follow up OT needed)  Equipment Recommendations  None recommended by OT       Precautions / Restrictions Precautions Precautions: Back Precaution Booklet Issued: Yes (comment) Required Braces or Orthoses: Spinal Brace Spinal Brace: Lumbar corset;Applied in sitting position      Mobility Bed Mobility Overal bed mobility: Modified Independent                  Transfers Overall transfer level: Independent                        Balance Overall balance assessment: Independent                                         ADL either performed or assessed with clinical judgement   ADL Overall ADL's : Modified independent                                       General ADL Comments: Educated on use of 2 cups for brushing teeth, wet wipes for back peri care, sit<>stand stance for lower surfaces to keep back straight, LB  dressing techniques, brace donning, not sitting for more than 20-30 minutes at a time to start wtih and building up to one hour, side step into tub     Vision Patient Visual Report: No change from baseline              Pertinent Vitals/Pain Pain Assessment Pain Assessment: 0-10 Pain Score: 2  Pain Location: incisonal Pain Descriptors / Indicators:  (stiff) Pain Intervention(s): Limited activity within patient's tolerance     Hand Dominance Right   Extremity/Trunk Assessment Upper Extremity Assessment Upper Extremity Assessment: Overall WFL for tasks assessed           Communication Communication Communication: No difficulties   Cognition Arousal/Alertness: Awake/alert Behavior During Therapy: WFL for tasks assessed/performed Overall Cognitive Status: Within Functional Limits for tasks assessed                                                  Home Living Family/patient expects to be discharged to:: Private residence Living Arrangements: Spouse/significant other Available Help at Discharge: Family;Available PRN/intermittently (  24 hours next 3 days) Type of Home: House Home Access: Level entry     Home Layout: One level     Bathroom Shower/Tub: Tub/shower unit;Curtain   Biochemist, clinical: Standard     Home Equipment: None   Additional Comments: adustable bed      Prior Functioning/Environment Prior Level of Function : Independent/Modified Independent                        OT Problem List: Decreased range of motion;Pain         OT Goals(Current goals can be found in the care plan section) Acute Rehab OT Goals Patient Stated Goal: to go home today         AM-PAC OT "6 Clicks" Daily Activity     Outcome Measure Help from another person eating meals?: None Help from another person taking care of personal grooming?: None Help from another person toileting, which includes using toliet, bedpan, or urinal?: None Help from  another person bathing (including washing, rinsing, drying)?: None Help from another person to put on and taking off regular upper body clothing?: None Help from another person to put on and taking off regular lower body clothing?: None 6 Click Score: 24   End of Session Equipment Utilized During Treatment: Back brace  Activity Tolerance: Patient tolerated treatment well Patient left:  (sitting EOB)  OT Visit Diagnosis: Pain Pain - part of body:  (incisonal (stiff))                Time: [id number] OT Time Calculation (min): 22 min Charges:  OT General Charges $OT Visit: 1 Visit OT Evaluation $OT Eval Moderate Complexity: 1 Mod  Gold[address] Circle, OTR/L Acute [employer] Pager [phone] Office [phone] '  Almon Register 05/01/2021, 9:25 AM

## 2021-05-05 MED FILL — Heparin Sodium (Porcine) Inj 1000 Unit/ML: INTRAMUSCULAR | Qty: 10 | Status: AC

## 2021-05-05 MED FILL — Sodium Chloride IV Soln 0.9%: INTRAVENOUS | Qty: 1000 | Status: AC

## 2021-08-05 ENCOUNTER — Other Ambulatory Visit: Payer: 59

## 2021-08-05 ENCOUNTER — Ambulatory Visit: Payer: 59 | Admitting: Oncology

## 2021-08-10 ENCOUNTER — Inpatient Hospital Stay: Payer: 59 | Attending: Oncology

## 2021-08-10 ENCOUNTER — Encounter: Payer: Self-pay | Admitting: Nurse Practitioner

## 2021-08-10 ENCOUNTER — Inpatient Hospital Stay (HOSPITAL_BASED_OUTPATIENT_CLINIC_OR_DEPARTMENT_OTHER): Payer: 59 | Admitting: Nurse Practitioner

## 2021-08-10 VITALS — BP 152/96 | HR 82 | Temp 98.7°F | Resp 20 | Wt 156.2 lb

## 2021-08-10 DIAGNOSIS — D72829 Elevated white blood cell count, unspecified: Secondary | ICD-10-CM

## 2021-08-10 DIAGNOSIS — Z87891 Personal history of nicotine dependence: Secondary | ICD-10-CM | POA: Insufficient documentation

## 2021-08-10 DIAGNOSIS — D75839 Thrombocytosis, unspecified: Secondary | ICD-10-CM | POA: Insufficient documentation

## 2021-08-10 DIAGNOSIS — I10 Essential (primary) hypertension: Secondary | ICD-10-CM | POA: Diagnosis not present

## 2021-08-10 DIAGNOSIS — D721 Eosinophilia, unspecified: Secondary | ICD-10-CM | POA: Diagnosis present

## 2021-08-10 LAB — CBC WITH DIFFERENTIAL/PLATELET
Abs Immature Granulocytes: 0.05 10*3/uL (ref 0.00–0.07)
Basophils Absolute: 0.1 10*3/uL (ref 0.0–0.1)
Basophils Relative: 1 %
Eosinophils Absolute: 0.5 10*3/uL (ref 0.0–0.5)
Eosinophils Relative: 5 %
HCT: 44.2 % (ref 39.0–52.0)
Hemoglobin: 14.7 g/dL (ref 13.0–17.0)
Immature Granulocytes: 1 %
Lymphocytes Relative: 24 %
Lymphs Abs: 2.4 10*3/uL (ref 0.7–4.0)
MCH: 30.9 pg (ref 26.0–34.0)
MCHC: 33.3 g/dL (ref 30.0–36.0)
MCV: 93.1 fL (ref 80.0–100.0)
Monocytes Absolute: 0.9 10*3/uL (ref 0.1–1.0)
Monocytes Relative: 9 %
Neutro Abs: 6 10*3/uL (ref 1.7–7.7)
Neutrophils Relative %: 60 %
Platelets: 422 10*3/uL — ABNORMAL HIGH (ref 150–400)
RBC: 4.75 MIL/uL (ref 4.22–5.81)
RDW: 12.7 % (ref 11.5–15.5)
WBC: 10.1 10*3/uL (ref 4.0–10.5)
nRBC: 0 % (ref 0.0–0.2)

## 2021-08-10 LAB — TECHNOLOGIST SMEAR REVIEW
Plt Morphology: NORMAL
RBC MORPHOLOGY: NORMAL
WBC MORPHOLOGY: NORMAL

## 2021-08-10 NOTE — Progress Notes (Signed)
?Hematology/Oncology Consult Note ?[phone] [phone] ? ? ?Patient Care Team: ?Lynnea Ferrier, MD as PCP - General (Internal Medicine) ? ?REFERRING PROVIDER: ?Lynnea Ferrier, MD  ? ?CHIEF COMPLAINTS/REASON FOR VISIT:  ?leukocytosis ? ?HISTORY OF PRESENTING ILLNESS:  ?Adam Newman is a  56 y.o.  male with PMH listed below who was referred to me for evaluation of leukocytosis ?Reviewed patient' recent labs obtained by PCP.  ? ?02/11/2021 CBC showed elevated white count of 12.8 ?Previous lab records reviewed. Leukocytosis onset of chronic, duration is since 2016 ? ?No aggravating or elevated factors. ?Denies weight loss, fever, chills,night sweats.  Fatigue ?Smoking history: some day smoker, 5-6 cigarettes daily  ?History of recent oral steroid use or steroid injection:  ?Denies recent infection:  ?He has asthma and he takes prednisone sometimes [1-2 times per year] for asthma/bronchitis flare.  ? ?INTERVAL HISTORY ?Adam Newman is a 56 y.o. male with above history of leukocytosis who returns to clinic for follow up. He has quit smoking. Continues to dip snuff. Avoid alcohol. Denies fevers, chills, night sweats. Feels well otherwise. No unintentional weight loss. Bowel movements are regular. Reports cancer screenings are up to date.  ? ?Review of Systems  ?Constitutional:  Negative for appetite change, fatigue and unexpected weight change.  ?HENT:   Negative for mouth sores, sore throat and trouble swallowing.   ?Respiratory:  Negative for chest tightness and shortness of breath.   ?Cardiovascular:  Negative for leg swelling.  ?Gastrointestinal:  Negative for abdominal pain, constipation, diarrhea, nausea and vomiting.  ?Genitourinary:  Negative for bladder incontinence and dysuria.   ?Musculoskeletal:  Negative for flank pain and neck stiffness.  ?Skin:  Negative for itching, rash and wound.  ?Neurological:  Negative for dizziness, headaches, light-headedness and numbness.   ?Psychiatric/Behavioral:  Negative for confusion, depression and sleep disturbance. The patient is not nervous/anxious.   ? ? ?MEDICAL HISTORY:  ?Past Medical History:  ?Diagnosis Date  ? Asthma   ? Hypertension   ? ? ?SURGICAL HISTORY: ?Past Surgical History:  ?Procedure Laterality Date  ? NASAL SEPTUM SURGERY Bilateral   ? ? ?SOCIAL HISTORY: ?Social History  ? ?Socioeconomic History  ? Marital status: Married  ?  Spouse name: Not on file  ? Number of children: Not on file  ? Years of education: Not on file  ? Highest education level: Not on file  ?Occupational History  ? Not on file  ?Tobacco Use  ? Smoking status: Some Days  ?  Packs/day: 0.25  ?  Types: Cigarettes  ? Smokeless tobacco: Current  ?  Types: Snuff  ?Vaping Use  ? Vaping Use: Never used  ?Substance and Sexual Activity  ? Alcohol use: Not Currently  ? Drug use: Never  ? Sexual activity: Yes  ?Other Topics Concern  ? Not on file  ?Social History Narrative  ? Not on file  ? ?Social Determinants of Health  ? ?Financial Resource Strain: Not on file  ?Food Insecurity: Not on file  ?Transportation Needs: Not on file  ?Physical Activity: Not on file  ?Stress: Not on file  ?Social Connections: Not on file  ?Intimate Partner Violence: Not on file  ? ? ?FAMILY HISTORY: ?Family History  ?Problem Relation Age of Onset  ? Diabetes Mother   ? Diabetes Father   ? Hemochromatosis Father   ? ? ?ALLERGIES:  is allergic to penicillins. ? ?MEDICATIONS:  ?Current Outpatient Medications  ?Medication Sig Dispense Refill  ? ADVAIR DISKUS 250-50 MCG/ACT AEPB Inhale  1 puff into the lungs 2 (two) times daily.    ? albuterol (PROVENTIL HFA;VENTOLIN HFA) 108 (90 Base) MCG/ACT inhaler Inhale 2 puffs into the lungs every 6 (six) hours as needed for wheezing or shortness of breath. 1 Inhaler 2  ? albuterol (PROVENTIL) (2.5 MG/3ML) 0.083% nebulizer solution Take 3 mLs (2.5 mg total) by nebulization every 6 (six) hours as needed for wheezing or shortness of breath. 75 mL 1  ?  cyclobenzaprine (FLEXERIL) 10 MG tablet Take 1 tablet (10 mg total) by mouth 3 (three) times daily as needed for muscle spasms. 50 tablet 0  ? docusate sodium (COLACE) 100 MG capsule Take 1 capsule (100 mg total) by mouth 2 (two) times daily. 60 capsule 0  ? fexofenadine (ALLEGRA) 180 MG tablet Take 180 mg by mouth in the morning.    ? fluticasone (FLONASE) 50 MCG/ACT nasal spray Place 2 sprays into the nose in the morning.    ? montelukast (SINGULAIR) 10 MG tablet Take 10 mg by mouth at bedtime.    ? oxyCODONE-acetaminophen (PERCOCET/ROXICET) 5-325 MG tablet Take 1-2 tablets by mouth every 4 (four) hours as needed for moderate pain. 30 tablet 0  ? sildenafil (VIAGRA) 50 MG tablet Take 50 mg by mouth daily as needed for erectile dysfunction.    ? ?No current facility-administered medications for this visit.  ? ? ? ?PHYSICAL EXAMINATION: ?ECOG PERFORMANCE STATUS: 0 - Asymptomatic ?Vitals:  ? 08/10/21 1034  ?BP: (!) 152/96  ?Pulse: 82  ?Resp: 20  ?Temp: 98.7 ?F (37.1 ?C)  ?SpO2: 100%  ? ?Filed Weights  ? 08/10/21 1034  ?Weight: 156 lb 3.2 oz (70.9 kg)  ? ? ?Physical Exam ?Constitutional:   ?   General: He is not in acute distress. ?HENT:  ?   Head: Normocephalic and atraumatic.  ?Eyes:  ?   General: No scleral icterus. ?Cardiovascular:  ?   Rate and Rhythm: Normal rate and regular rhythm.  ?   Heart sounds: Normal heart sounds.  ?Pulmonary:  ?   Effort: Pulmonary effort is normal. No respiratory distress.  ?   Breath sounds: No wheezing.  ?Abdominal:  ?   General: Bowel sounds are normal. There is no distension.  ?   Palpations: Abdomen is soft.  ?Musculoskeletal:     ?   General: No deformity. Normal range of motion.  ?   Cervical back: Normal range of motion and neck supple.  ?Skin: ?   General: Skin is warm and dry.  ?   Findings: No erythema or rash.  ?Neurological:  ?   Mental Status: He is alert and oriented to person, place, and time. Mental status is at baseline.  ?   Cranial Nerves: No cranial nerve deficit.   ?   Coordination: Coordination normal.  ?Psychiatric:     ?   Mood and Affect: Mood normal.  ? ?RADIOGRAPHIC STUDIES: ?I have personally reviewed the radiological images as listed and agreed with the findings in the report. ?No results found. ? ?LABORATORY DATA:  ?I have reviewed the data as listed ?Lab Results  ?Component Value Date  ? WBC 10.1 08/10/2021  ? HGB 14.7 08/10/2021  ? HCT 44.2 08/10/2021  ? MCV 93.1 08/10/2021  ? PLT 422 (H) 08/10/2021  ? ?Recent Labs  ?  03/11/21 ?1533 04/27/21 ?0932 05/01/21 ?6073  ?NA 137 140 138  ?K 3.6 4.8 3.7  ?CL 101 102 106  ?CO2 25 29 25   ?GLUCOSE 102* 114* 111*  ?BUN 13 12  8  ?CREATININE 0.87 1.01 0.84  ?CALCIUM 8.8* 9.6 8.4*  ?GFRNONAA >60 >60 >60  ?PROT 7.2  --   --   ?ALBUMIN 4.1  --   --   ?AST 17  --   --   ?ALT 18  --   --   ?ALKPHOS 58  --   --   ?BILITOT 0.2*  --   --   ? ? ?Iron/TIBC/Ferritin/ %Sat ?No results found for: IRON, TIBC, FERRITIN, IRONPCTSAT  ? ? ? ? ?ASSESSMENT & PLAN:  ?No diagnosis found. ? ?Leukocytosis-chronic.  Predominantly neutrophilia and eosinophilia.  Peripheral blood flow cytometry, myeloma panel, hepatitis panel, HIV, ANA were all negative.  Clinically, he remains asymptomatic.  Etiology is unclear though suspect reactive.  Previously thought to be secondary to smoking which he has now stopped and leukocytosis is now resolved.  Hold off on additional work-up for leukocytosis at this time.  Continue to monitor. ? ?Thrombocytosis-chronic and remains mild.  Thought to also be reactive.  Hold off on additional work-up at this time.  If progressively worsening, recommend work-up for thrombocytosis at that time. ? ?Smoking-patient has now stopped smoking. Does not meet criteria for low dose ct scans. Applauded him for stopping smoking.  ? ?Tobacco dependency-patient continues to use oral tobacco/snuff. Reviewed need for surveillance for oral cancers. Encouraged cessation.  ? ?Disposition:  ?6 mo- lab, Dr Cathie HoopsYu for leukocytosis follow up- la ? ?No  orders of the defined types were placed in this encounter. ?  ?All questions were answered. The patient knows to call the clinic with any problems questions or concerns. ? ?Consuello MasseLauren Lisaanne Lawrie, DNP, AGNP-C ?[facility]

## 2022-02-15 ENCOUNTER — Other Ambulatory Visit: Payer: Self-pay

## 2022-02-15 ENCOUNTER — Inpatient Hospital Stay: Payer: 59 | Attending: Oncology

## 2022-02-15 ENCOUNTER — Inpatient Hospital Stay (HOSPITAL_BASED_OUTPATIENT_CLINIC_OR_DEPARTMENT_OTHER): Payer: 59 | Admitting: Oncology

## 2022-02-15 ENCOUNTER — Encounter: Payer: Self-pay | Admitting: Oncology

## 2022-02-15 VITALS — BP 149/109 | HR 75 | Temp 97.1°F | Wt 163.1 lb

## 2022-02-15 DIAGNOSIS — D72829 Elevated white blood cell count, unspecified: Secondary | ICD-10-CM

## 2022-02-15 DIAGNOSIS — Z720 Tobacco use: Secondary | ICD-10-CM

## 2022-02-15 DIAGNOSIS — D75839 Thrombocytosis, unspecified: Secondary | ICD-10-CM

## 2022-02-15 DIAGNOSIS — F17290 Nicotine dependence, other tobacco product, uncomplicated: Secondary | ICD-10-CM | POA: Diagnosis not present

## 2022-02-15 DIAGNOSIS — F17210 Nicotine dependence, cigarettes, uncomplicated: Secondary | ICD-10-CM | POA: Diagnosis not present

## 2022-02-15 LAB — CBC WITH DIFFERENTIAL/PLATELET
Abs Immature Granulocytes: 0.04 10*3/uL (ref 0.00–0.07)
Basophils Absolute: 0.2 10*3/uL — ABNORMAL HIGH (ref 0.0–0.1)
Basophils Relative: 2 %
Eosinophils Absolute: 0.8 10*3/uL — ABNORMAL HIGH (ref 0.0–0.5)
Eosinophils Relative: 8 %
HCT: 45.8 % (ref 39.0–52.0)
Hemoglobin: 15.3 g/dL (ref 13.0–17.0)
Immature Granulocytes: 0 %
Lymphocytes Relative: 24 %
Lymphs Abs: 2.5 10*3/uL (ref 0.7–4.0)
MCH: 30.8 pg (ref 26.0–34.0)
MCHC: 33.4 g/dL (ref 30.0–36.0)
MCV: 92.2 fL (ref 80.0–100.0)
Monocytes Absolute: 0.8 10*3/uL (ref 0.1–1.0)
Monocytes Relative: 8 %
Neutro Abs: 6 10*3/uL (ref 1.7–7.7)
Neutrophils Relative %: 58 %
Platelets: 411 10*3/uL — ABNORMAL HIGH (ref 150–400)
RBC: 4.97 MIL/uL (ref 4.22–5.81)
RDW: 12.7 % (ref 11.5–15.5)
WBC: 10.2 10*3/uL (ref 4.0–10.5)
nRBC: 0 % (ref 0.0–0.2)

## 2022-02-15 NOTE — Assessment & Plan Note (Signed)
Improved. Still slightly increased.  Likely reactive.  He is asymptomatic, recommend observation.  If persistent and progressing, will initiate thrombocytosis work up

## 2022-02-15 NOTE — Progress Notes (Signed)
Hematology/Oncology Consult note [phone] [phone]   Patient Care Team: Lynnea Ferrier, MD as PCP - General (Internal Medicine)  ASSESSMENT & PLAN:   Leukocytosis Resolved. Likely reactive.   Thrombocytosis Improved. Still slightly increased.  Likely reactive.  He is asymptomatic, recommend observation.  If persistent and progressing, will initiate thrombocytosis work up  Orders Placed This Encounter  Procedures   CBC with Differential/Platelet    Y+1    Standing Status:   Future    Standing Expiration Date:   02/15/2023   Comprehensive metabolic panel    Y+1    Standing Status:   Future    Standing Expiration Date:   02/15/2023   Follow up in 6 months. All questions were answered. The patient knows to call the clinic with any problems, questions or concerns.  Rickard Patience, MD, PhD [facility] Hematology Oncology 02/15/2022   CHIEF COMPLAINTS/REASON FOR VISIT:  leukocytosis  HISTORY OF PRESENTING ILLNESS:  Adam Newman is a  56 y.o.  male with PMH listed below who was referred to me for evaluation of leukocytosis Reviewed patient' recent labs obtained by PCP.   02/11/2021 CBC showed elevated white count of 12.8 Previous lab records reviewed. Leukocytosis onset of chronic, duration is since 2016  No aggravating or elevated factors. Denies weight loss, fever, chills,night sweats.  Fatigue Smoking history: some day smoker, 5-6 cigarettes daily  History of recent oral steroid use or steroid injection:  Denies recent infection:  He has asthma and he takes prednisone sometimes [1-2 times per year] for asthma/bronchitis flare.   INTERVAL HISTORY Adam Newman is a 56 y.o. male who has above history reviewed by me today presents for follow up visit for leukocytosis and thrombocytosis.  He has no new complaints.   Former smoker, currently he dips  Review of Systems  Constitutional:  Positive for fatigue. Negative for appetite change, chills,  fever and unexpected weight change.  HENT:   Negative for hearing loss and voice change.   Eyes:  Negative for eye problems and icterus.  Respiratory:  Negative for chest tightness, cough and shortness of breath.   Cardiovascular:  Negative for chest pain and leg swelling.  Gastrointestinal:  Negative for abdominal distention and abdominal pain.  Endocrine: Negative for hot flashes.  Genitourinary:  Negative for difficulty urinating, dysuria and frequency.   Musculoskeletal:  Positive for back pain. Negative for arthralgias.  Skin:  Negative for itching and rash.  Neurological:  Negative for light-headedness and numbness.  Hematological:  Negative for adenopathy. Does not bruise/bleed easily.  Psychiatric/Behavioral:  Negative for confusion.      MEDICAL HISTORY:  Past Medical History:  Diagnosis Date   Asthma    Hypertension     SURGICAL HISTORY: Past Surgical History:  Procedure Laterality Date   NASAL SEPTUM SURGERY Bilateral     SOCIAL HISTORY: Social History   Socioeconomic History   Marital status: Married    Spouse name: Not on file   Number of children: Not on file   Years of education: Not on file   Highest education level: Not on file  Occupational History   Not on file  Tobacco Use   Smoking status: Former    Packs/day: 0.25    Types: Cigarettes    Quit date: 04/29/2021    Years since quitting: 0.8   Smokeless tobacco: Current    Types: Snuff  Vaping Use   Vaping Use: Never used  Substance and Sexual Activity   Alcohol use:  Not Currently   Drug use: Never   Sexual activity: Yes  Other Topics Concern   Not on file  Social History Narrative   Not on file   Social Determinants of Health   Financial Resource Strain: Not on file  Food Insecurity: Not on file  Transportation Needs: Not on file  Physical Activity: Not on file  Stress: Not on file  Social Connections: Not on file  Intimate Partner Violence: Not on file    FAMILY HISTORY: Family  History  Problem Relation Age of Onset   Diabetes Mother    Diabetes Father    Hemochromatosis Father     ALLERGIES:  is allergic to penicillins.  MEDICATIONS:  Current Outpatient Medications  Medication Sig Dispense Refill   ADVAIR DISKUS 250-50 MCG/ACT AEPB Inhale 1 puff into the lungs 2 (two) times daily.     albuterol (PROVENTIL HFA;VENTOLIN HFA) 108 (90 Base) MCG/ACT inhaler Inhale 2 puffs into the lungs every 6 (six) hours as needed for wheezing or shortness of breath. 1 Inhaler 2   albuterol (PROVENTIL) (2.5 MG/3ML) 0.083% nebulizer solution Take 3 mLs (2.5 mg total) by nebulization every 6 (six) hours as needed for wheezing or shortness of breath. 75 mL 1   fexofenadine (ALLEGRA) 180 MG tablet Take 180 mg by mouth in the morning.     fluticasone (FLONASE) 50 MCG/ACT nasal spray Place 2 sprays into the nose in the morning.     montelukast (SINGULAIR) 10 MG tablet Take 10 mg by mouth at bedtime.     sildenafil (VIAGRA) 50 MG tablet Take 50 mg by mouth daily as needed for erectile dysfunction.     cyclobenzaprine (FLEXERIL) 10 MG tablet Take 1 tablet (10 mg total) by mouth 3 (three) times daily as needed for muscle spasms. (Patient not taking: Reported on 02/15/2022) 50 tablet 0   docusate sodium (COLACE) 100 MG capsule Take 1 capsule (100 mg total) by mouth 2 (two) times daily. (Patient not taking: Reported on 02/15/2022) 60 capsule 0   oxyCODONE-acetaminophen (PERCOCET/ROXICET) 5-325 MG tablet Take 1-2 tablets by mouth every 4 (four) hours as needed for moderate pain. (Patient not taking: Reported on 02/15/2022) 30 tablet 0   No current facility-administered medications for this visit.     PHYSICAL EXAMINATION: ECOG PERFORMANCE STATUS: 0 - Asymptomatic Vitals:   02/15/22 1050  BP: (!) 149/109  Pulse: 75  Temp: (!) 97.1 F (36.2 C)  SpO2: 100%   Filed Weights   02/15/22 1050  Weight: 163 lb 1.6 oz (74 kg)    Physical Exam Constitutional:      General: He is not in  acute distress. HENT:     Head: Normocephalic and atraumatic.  Eyes:     General: No scleral icterus. Cardiovascular:     Rate and Rhythm: Normal rate and regular rhythm.     Heart sounds: Normal heart sounds.  Pulmonary:     Effort: Pulmonary effort is normal. No respiratory distress.     Breath sounds: No wheezing.  Abdominal:     General: Bowel sounds are normal. There is no distension.     Palpations: Abdomen is soft.  Musculoskeletal:        General: No deformity. Normal range of motion.     Cervical back: Normal range of motion and neck supple.  Skin:    General: Skin is warm and dry.     Findings: No erythema or rash.  Neurological:     Mental Status: He is alert  and oriented to person, place, and time. Mental status is at baseline.     Cranial Nerves: No cranial nerve deficit.     Coordination: Coordination normal.  Psychiatric:        Mood and Affect: Mood normal.         RADIOGRAPHIC STUDIES: I have personally reviewed the radiological images as listed and agreed with the findings in the report. No results found.  LABORATORY DATA:  I have reviewed the data as listed    Latest Ref Rng & Units 02/15/2022   10:18 AM 08/10/2021   10:25 AM 05/01/2021    6:42 AM  CBC  WBC 4.0 - 10.5 K/uL 10.2  10.1  18.7   Hemoglobin 13.0 - 17.0 g/dL 10.1  75.1  02.5   Hematocrit 39.0 - 52.0 % 45.8  44.2  35.4   Platelets 150 - 400 K/uL 411  422  331       Latest Ref Rng & Units 05/01/2021    6:42 AM 04/27/2021    9:32 AM 03/11/2021    3:33 PM  CMP  Glucose 70 - 99 mg/dL 852  778  242   BUN 6 - 20 mg/dL 8  12  13    Creatinine 0.61 - 1.24 mg/dL  3.53  6.14   Sodium 135 - 145 mmol/L 138  140  137   Potassium 3.5 - 5.1 mmol/L 3.7  4.8  3.6   Chloride 98 - 111 mmol/L 106  102  101   CO2 22 - 32 mmol/L 25  29  25    Calcium 8.9 - 10.3 mg/dL 8.4  9.6  8.8   Total Protein 6.5 - 8.1 g/dL   7.2   Total Bilirubin 0.3 - 1.2 mg/dL   0.2   Alkaline Phos 38 - 126 U/L   58   AST  15 - 41 U/L   17   ALT 0 - 44 U/L   18

## 2022-02-15 NOTE — Assessment & Plan Note (Addendum)
Previous work up showed Negative peripheral blood flow cytometry, myeloma panel, negative hepatitis panel, HIV, negative ANA. Resolved. Likely reactive.

## 2022-07-23 ENCOUNTER — Other Ambulatory Visit: Payer: Self-pay | Admitting: Internal Medicine

## 2022-07-23 DIAGNOSIS — I10 Essential (primary) hypertension: Secondary | ICD-10-CM

## 2022-07-23 DIAGNOSIS — E785 Hyperlipidemia, unspecified: Secondary | ICD-10-CM

## 2022-07-23 DIAGNOSIS — Z0000 Encounter for general adult medical examination without abnormal findings: Secondary | ICD-10-CM

## 2022-08-03 ENCOUNTER — Ambulatory Visit
Admission: RE | Admit: 2022-08-03 | Discharge: 2022-08-03 | Disposition: A | Discharge status: Home / Self Care | Payer: Self-pay | Source: Ambulatory Visit | Attending: Internal Medicine | Admitting: Internal Medicine

## 2022-08-03 DIAGNOSIS — E785 Hyperlipidemia, unspecified: Secondary | ICD-10-CM | POA: Insufficient documentation

## 2022-08-03 DIAGNOSIS — Z0000 Encounter for general adult medical examination without abnormal findings: Secondary | ICD-10-CM | POA: Insufficient documentation

## 2022-08-03 DIAGNOSIS — I10 Essential (primary) hypertension: Secondary | ICD-10-CM | POA: Insufficient documentation

## 2023-02-16 ENCOUNTER — Ambulatory Visit: Payer: 59 | Admitting: Oncology

## 2023-02-16 ENCOUNTER — Other Ambulatory Visit: Payer: 59

## 2023-02-18 ENCOUNTER — Other Ambulatory Visit: Payer: Self-pay

## 2023-02-18 DIAGNOSIS — D72829 Elevated white blood cell count, unspecified: Secondary | ICD-10-CM

## 2023-02-21 ENCOUNTER — Inpatient Hospital Stay: Payer: 59 | Admitting: Oncology

## 2023-02-21 ENCOUNTER — Inpatient Hospital Stay: Payer: 59 | Attending: Oncology

## 2023-02-21 ENCOUNTER — Encounter: Payer: Self-pay | Admitting: Oncology

## 2023-02-21 ENCOUNTER — Inpatient Hospital Stay: Payer: 59

## 2023-02-21 VITALS — BP 143/100 | HR 85 | Temp 95.9°F | Resp 18 | Wt 161.3 lb

## 2023-02-21 DIAGNOSIS — D72824 Basophilia: Secondary | ICD-10-CM | POA: Insufficient documentation

## 2023-02-21 DIAGNOSIS — F17220 Nicotine dependence, chewing tobacco, uncomplicated: Secondary | ICD-10-CM | POA: Insufficient documentation

## 2023-02-21 DIAGNOSIS — D75839 Thrombocytosis, unspecified: Secondary | ICD-10-CM | POA: Diagnosis present

## 2023-02-21 DIAGNOSIS — J45909 Unspecified asthma, uncomplicated: Secondary | ICD-10-CM | POA: Diagnosis not present

## 2023-02-21 DIAGNOSIS — D72829 Elevated white blood cell count, unspecified: Secondary | ICD-10-CM

## 2023-02-21 LAB — CBC WITH DIFFERENTIAL (CANCER CENTER ONLY)
Abs Immature Granulocytes: 0.05 10*3/uL (ref 0.00–0.07)
Basophils Absolute: 0.2 10*3/uL — ABNORMAL HIGH (ref 0.0–0.1)
Basophils Relative: 2 %
Eosinophils Absolute: 0.6 10*3/uL — ABNORMAL HIGH (ref 0.0–0.5)
Eosinophils Relative: 6 %
HCT: 42.8 % (ref 39.0–52.0)
Hemoglobin: 14.5 g/dL (ref 13.0–17.0)
Immature Granulocytes: 1 %
Lymphocytes Relative: 25 %
Lymphs Abs: 2.6 10*3/uL (ref 0.7–4.0)
MCH: 30.5 pg (ref 26.0–34.0)
MCHC: 33.9 g/dL (ref 30.0–36.0)
MCV: 90.1 fL (ref 80.0–100.0)
Monocytes Absolute: 0.8 10*3/uL (ref 0.1–1.0)
Monocytes Relative: 8 %
Neutro Abs: 6.2 10*3/uL (ref 1.7–7.7)
Neutrophils Relative %: 58 %
Platelet Count: 413 10*3/uL — ABNORMAL HIGH (ref 150–400)
RBC: 4.75 MIL/uL (ref 4.22–5.81)
RDW: 12.4 % (ref 11.5–15.5)
WBC Count: 10.5 10*3/uL (ref 4.0–10.5)
nRBC: 0 % (ref 0.0–0.2)

## 2023-02-21 LAB — CMP (CANCER CENTER ONLY)
ALT: 19 U/L (ref 0–44)
AST: 16 U/L (ref 15–41)
Albumin: 4.4 g/dL (ref 3.5–5.0)
Alkaline Phosphatase: 63 U/L (ref 38–126)
Anion gap: 9 (ref 5–15)
BUN: 13 mg/dL (ref 6–20)
CO2: 26 mmol/L (ref 22–32)
Calcium: 8.9 mg/dL (ref 8.9–10.3)
Chloride: 102 mmol/L (ref 98–111)
Creatinine: 0.89 mg/dL (ref 0.61–1.24)
GFR, Estimated: >60 mL/min (ref >60)
Glucose, Bld: 111 mg/dL — ABNORMAL HIGH (ref 70–99)
Potassium: 4.2 mmol/L (ref 3.5–5.1)
Sodium: 137 mmol/L (ref 135–145)
Total Bilirubin: 0.6 mg/dL (ref <1.2)
Total Protein: 7.3 g/dL (ref 6.5–8.1)

## 2023-02-21 LAB — TECHNOLOGIST SMEAR REVIEW
Plt Morphology: NORMAL
RBC MORPHOLOGY: NORMAL
WBC MORPHOLOGY: NORMAL

## 2023-02-21 NOTE — Progress Notes (Signed)
Hematology/Oncology Consult note [phone] [phone]   Patient Care Team: Lynnea Ferrier, MD as PCP - General (Internal Medicine) Rickard Patience, MD as Consulting Physician (Oncology)  ASSESSMENT & PLAN:   Thrombocytosis Improved. Still slightly increased.  Likely reactive.  Observation.  Basophilia Persistent eosinophilia or basophilia.  I will check BCR-ABL 1 FISH JAK2 mutation with reflex.  Orders Placed This Encounter  Procedures   BCR-ABL1 FISH    Standing Status:   Future    Number of Occurrences:   1    Standing Expiration Date:   02/21/2024   JAK2 [id number] rfx CALR/MPL/E12-15    Standing Status:   Future    Number of Occurrences:   1    Standing Expiration Date:   02/21/2024   Technologist smear review    Order Specific Question:   Clinical information:    Answer:   basophilia, essinophilia   Follow-up to be determined All questions were answered. The patient knows to call the clinic with any problems, questions or concerns.  Rickard Patience, MD, PhD [facility] Hematology Oncology 02/21/2023   CHIEF COMPLAINTS/REASON FOR VISIT:  Leukocytosis, thrombocytosis  HISTORY OF PRESENTING ILLNESS:  Adam Newman is a  57 y.o.  male with PMH listed below who was referred to me for evaluation of leukocytosis Reviewed patient' recent labs obtained by PCP.   02/11/2021 CBC showed elevated white count of 12.8 Previous lab records reviewed. Leukocytosis onset of chronic, duration is since 2016  No aggravating or elevated factors. Denies weight loss, fever, chills,night sweats.  Fatigue Smoking history: some day smoker, 5-6 cigarettes daily  History of recent oral steroid use or steroid injection:  Denies recent infection:  He has asthma and he takes prednisone sometimes [1-2 times per year] for asthma/bronchitis flare.   INTERVAL HISTORY Adam Newman is a 57 y.o. male who has above history reviewed by me today presents for follow up visit for  leukocytosis and thrombocytosis.  He has no new complaints.   Former smoker, currently he dips tobacco  Review of Systems  Constitutional:  Positive for fatigue. Negative for appetite change, chills, fever and unexpected weight change.  HENT:   Negative for hearing loss and voice change.   Eyes:  Negative for eye problems and icterus.  Respiratory:  Negative for chest tightness, cough and shortness of breath.   Cardiovascular:  Negative for chest pain and leg swelling.  Gastrointestinal:  Negative for abdominal distention and abdominal pain.  Endocrine: Negative for hot flashes.  Genitourinary:  Negative for difficulty urinating, dysuria and frequency.   Musculoskeletal:  Positive for back pain. Negative for arthralgias.  Skin:  Negative for itching and rash.  Neurological:  Negative for light-headedness and numbness.  Hematological:  Negative for adenopathy. Does not bruise/bleed easily.  Psychiatric/Behavioral:  Negative for confusion.      MEDICAL HISTORY:  Past Medical History:  Diagnosis Date   Asthma    Hypertension     SURGICAL HISTORY: Past Surgical History:  Procedure Laterality Date   NASAL SEPTUM SURGERY Bilateral     SOCIAL HISTORY: Social History   Socioeconomic History   Marital status: Married    Spouse name: Not on file   Number of children: Not on file   Years of education: Not on file   Highest education level: Not on file  Occupational History   Not on file  Tobacco Use   Smoking status: Former    Current packs/day: 0.00    Types: Cigarettes  Quit date: 04/29/2021    Years since quitting: 1.8   Smokeless tobacco: Current    Types: Snuff  Vaping Use   Vaping status: Never Used  Substance and Sexual Activity   Alcohol use: Not Currently   Drug use: Never   Sexual activity: Yes  Other Topics Concern   Not on file  Social History Narrative   Not on file   Social Determinants of Health   Financial Resource Strain: Low Risk  (01/25/2023)    Received from [facility]   Overall Financial Resource Strain (CARDIA)    Difficulty of Paying Living Expenses: Not hard at all  Food Insecurity: No Food Insecurity (01/25/2023)   Received from [facility]   Hunger Vital Sign    Worried About Running Out of Food in the Last Year: Never true    Ran Out of Food in the Last Year: Never true  Transportation Needs: No Transportation Needs (01/25/2023)   Received from [facility] - [facility]    In the past 12 months, has lack of transportation kept you from medical appointments or from getting medications?: No    Lack of Transportation (Non-Medical): No  Physical Activity: Not on file  Stress: Not on file  Social Connections: Not on file  Intimate Partner Violence: Not on file    FAMILY HISTORY: Family History  Problem Relation Age of Onset   Diabetes Mother    Diabetes Father    Hemochromatosis Father     ALLERGIES:  is allergic to penicillins.  MEDICATIONS:  Current Outpatient Medications  Medication Sig Dispense Refill   ADVAIR DISKUS 250-50 MCG/ACT AEPB Inhale 1 puff into the lungs 2 (two) times daily.     albuterol (PROVENTIL HFA;VENTOLIN HFA) 108 (90 Base) MCG/ACT inhaler Inhale 2 puffs into the lungs every 6 (six) hours as needed for wheezing or shortness of breath. 1 Inhaler 2   albuterol (PROVENTIL) (2.5 MG/3ML) 0.083% nebulizer solution Take 3 mLs (2.5 mg total) by nebulization every 6 (six) hours as needed for wheezing or shortness of breath. 75 mL 1   fexofenadine (ALLEGRA) 180 MG tablet Take 180 mg by mouth in the morning.     fluticasone (FLONASE) 50 MCG/ACT nasal spray Place 2 sprays into the nose in the morning.     montelukast (SINGULAIR) 10 MG tablet Take 10 mg by mouth at bedtime.     sildenafil (VIAGRA) 50 MG tablet Take 50 mg by mouth daily as needed for erectile dysfunction.     cyclobenzaprine (FLEXERIL) 10 MG tablet Take 1 tablet (10 mg  total) by mouth 3 (three) times daily as needed for muscle spasms. (Patient not taking: Reported on 02/15/2022) 50 tablet 0   docusate sodium (COLACE) 100 MG capsule Take 1 capsule (100 mg total) by mouth 2 (two) times daily. (Patient not taking: Reported on 02/15/2022) 60 capsule 0   oxyCODONE-acetaminophen (PERCOCET/ROXICET) 5-325 MG tablet Take 1-2 tablets by mouth every 4 (four) hours as needed for moderate pain. (Patient not taking: Reported on 02/15/2022) 30 tablet 0   No current facility-administered medications for this visit.     PHYSICAL EXAMINATION: ECOG PERFORMANCE STATUS: 0 - Asymptomatic Vitals:   02/21/23 1304 02/21/23 1316  BP: (!) 141/92 (!) 143/100  Pulse: 85   Resp: 18   Temp: (!) 95.9 F (35.5 C)   SpO2: 99%    Filed Weights   02/21/23 1304  Weight: 161 lb 4.8 oz (73.2 kg)  Physical Exam Constitutional:      General: He is not in acute distress. HENT:     Head: Normocephalic and atraumatic.  Eyes:     General: No scleral icterus. Cardiovascular:     Rate and Rhythm: Normal rate and regular rhythm.     Heart sounds: Normal heart sounds.  Pulmonary:     Effort: Pulmonary effort is normal. No respiratory distress.     Breath sounds: No wheezing.  Abdominal:     General: Bowel sounds are normal. There is no distension.     Palpations: Abdomen is soft.  Musculoskeletal:        General: No deformity. Normal range of motion.     Cervical back: Normal range of motion and neck supple.  Skin:    General: Skin is warm and dry.     Findings: No erythema or rash.  Neurological:     Mental Status: He is alert and oriented to person, place, and time. Mental status is at baseline.     Cranial Nerves: No cranial nerve deficit.     Coordination: Coordination normal.  Psychiatric:        Mood and Affect: Mood normal.         RADIOGRAPHIC STUDIES: I have personally reviewed the radiological images as listed and agreed with the findings in the report. No  results found.  LABORATORY DATA:  I have reviewed the data as listed    Latest Ref Rng & Units 02/21/2023   12:48 PM 02/15/2022   10:18 AM 08/10/2021   10:25 AM  CBC  WBC 4.0 - 10.5 K/uL 10.5  10.2  10.1   Hemoglobin 13.0 - 17.0 g/dL 16.1  09.6  04.5   Hematocrit 39.0 - 52.0 % 42.8  45.8  44.2   Platelets 150 - 400 K/uL 413  411  422       Latest Ref Rng & Units 02/21/2023   12:48 PM 05/01/2021    6:42 AM 04/27/2021    9:32 AM  CMP  Glucose 70 - 99 mg/dL 409  811  914   BUN 6 - 20 mg/dL 13  8  12    Creatinine 0.61 - 1.24 mg/dL 7.82  9.56  2.13   Sodium 135 - 145 mmol/L 137  138  140   Potassium 3.5 - 5.1 mmol/L 4.2  3.7  4.8   Chloride 98 - 111 mmol/L 102  106  102   CO2 22 - 32 mmol/L 26  25  29    Calcium 8.9 - 10.3 mg/dL 8.9  8.4  9.6   Total Protein 6.5 - 8.1 g/dL 7.3     Total Bilirubin <1.2 mg/dL 0.6     Alkaline Phos 38 - 126 U/L 63     AST 15 - 41 U/L 16     ALT 0 - 44 U/L 19

## 2023-02-21 NOTE — Assessment & Plan Note (Signed)
Persistent eosinophilia or basophilia.  I will check BCR-ABL 1 FISH JAK2 mutation with reflex.

## 2023-02-21 NOTE — Assessment & Plan Note (Addendum)
Improved. Still slightly increased.  Likely reactive.  Observation.

## 2023-02-25 LAB — BCR-ABL1 FISH
Cells Analyzed: 200
Cells Counted: 200

## 2023-02-25 LAB — JAK2 V617F RFX CALR/MPL/E12-15

## 2023-02-25 LAB — CALR +MPL + E12-E15  (REFLEX)
